# Patient Record
Sex: Female | Born: 1985 | Race: White | Hispanic: No | Marital: Married | Smoking: Former smoker
Health system: Southern US, Community
[De-identification: ages and names within clinical notes are randomized; demographics above are authoritative.]

## PROBLEM LIST (undated history)

## (undated) ENCOUNTER — Inpatient Hospital Stay: Payer: Self-pay

## (undated) DIAGNOSIS — F329 Major depressive disorder, single episode, unspecified: Secondary | ICD-10-CM

## (undated) DIAGNOSIS — R011 Cardiac murmur, unspecified: Secondary | ICD-10-CM

## (undated) DIAGNOSIS — F419 Anxiety disorder, unspecified: Secondary | ICD-10-CM

## (undated) DIAGNOSIS — R51 Headache: Secondary | ICD-10-CM

## (undated) DIAGNOSIS — R112 Nausea with vomiting, unspecified: Secondary | ICD-10-CM

## (undated) DIAGNOSIS — K219 Gastro-esophageal reflux disease without esophagitis: Secondary | ICD-10-CM

## (undated) DIAGNOSIS — Z9889 Other specified postprocedural states: Secondary | ICD-10-CM

## (undated) DIAGNOSIS — W44F3XA Food entering into or through a natural orifice, initial encounter: Secondary | ICD-10-CM

## (undated) DIAGNOSIS — F32A Depression, unspecified: Secondary | ICD-10-CM

## (undated) DIAGNOSIS — T4145XA Adverse effect of unspecified anesthetic, initial encounter: Secondary | ICD-10-CM

## (undated) DIAGNOSIS — T8859XA Other complications of anesthesia, initial encounter: Secondary | ICD-10-CM

## (undated) DIAGNOSIS — R519 Headache, unspecified: Secondary | ICD-10-CM

## (undated) DIAGNOSIS — Z8614 Personal history of Methicillin resistant Staphylococcus aureus infection: Secondary | ICD-10-CM

## (undated) HISTORY — PX: TONSILLECTOMY: SUR1361

## (undated) SURGERY — Surgical Case
Anesthesia: Spinal

---
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
## 2005-09-23 ENCOUNTER — Emergency Department: Payer: Self-pay | Admitting: Emergency Medicine

## 2005-11-09 ENCOUNTER — Ambulatory Visit (HOSPITAL_BASED_OUTPATIENT_CLINIC_OR_DEPARTMENT_OTHER): Admission: RE | Admit: 2005-11-09 | Discharge: 2005-11-09 | Payer: Self-pay | Admitting: Otolaryngology

## 2006-10-22 DIAGNOSIS — Z8614 Personal history of Methicillin resistant Staphylococcus aureus infection: Secondary | ICD-10-CM

## 2006-10-22 HISTORY — DX: Personal history of Methicillin resistant Staphylococcus aureus infection: Z86.14

## 2008-01-30 ENCOUNTER — Emergency Department: Payer: Self-pay | Admitting: Emergency Medicine

## 2008-08-26 ENCOUNTER — Ambulatory Visit: Payer: Self-pay | Admitting: Family Medicine

## 2008-09-20 ENCOUNTER — Emergency Department: Payer: Self-pay | Admitting: Emergency Medicine

## 2008-10-11 ENCOUNTER — Encounter: Payer: Self-pay | Admitting: Maternal & Fetal Medicine

## 2008-11-15 ENCOUNTER — Observation Stay: Payer: Self-pay | Admitting: Obstetrics & Gynecology

## 2008-12-20 ENCOUNTER — Encounter: Payer: Self-pay | Admitting: Obstetrics & Gynecology

## 2008-12-30 ENCOUNTER — Encounter: Payer: Self-pay | Admitting: Obstetrics and Gynecology

## 2009-01-06 ENCOUNTER — Encounter: Payer: Self-pay | Admitting: Maternal & Fetal Medicine

## 2009-01-31 ENCOUNTER — Encounter: Payer: Self-pay | Admitting: Obstetrics and Gynecology

## 2009-02-07 ENCOUNTER — Encounter: Payer: Self-pay | Admitting: Obstetrics and Gynecology

## 2009-02-14 ENCOUNTER — Encounter: Payer: Self-pay | Admitting: Maternal & Fetal Medicine

## 2009-02-21 ENCOUNTER — Encounter: Payer: Self-pay | Admitting: Obstetrics & Gynecology

## 2009-02-26 ENCOUNTER — Observation Stay: Payer: Self-pay | Admitting: Obstetrics and Gynecology

## 2009-02-28 ENCOUNTER — Inpatient Hospital Stay: Payer: Self-pay | Admitting: Obstetrics & Gynecology

## 2009-02-28 ENCOUNTER — Encounter: Payer: Self-pay | Admitting: Maternal & Fetal Medicine

## 2010-05-04 IMAGING — US ULTRAOUND OB LIMITED - NRPT [facility]
1 series · 14 of 14 positions shown · non-contrast
Comparison: none

[Series 1: ultraound ob limited - nrpt [facility] · 14 of 14 slices shown]
[im 1/14]
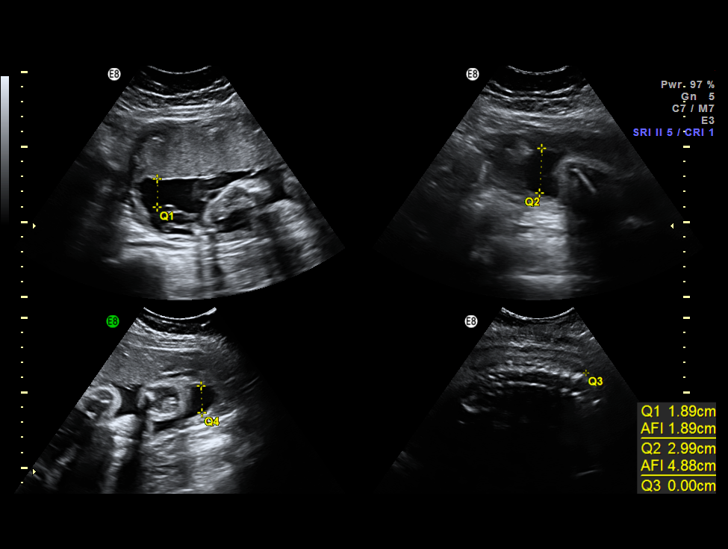
[im 2/14]
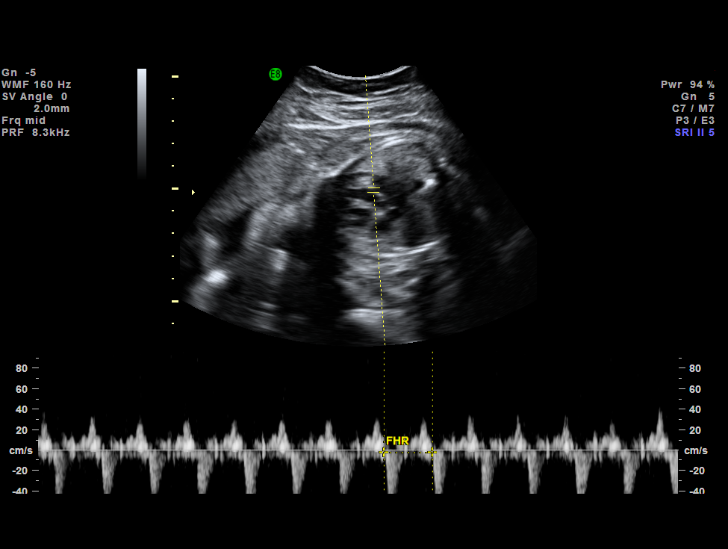
[im 3/14]
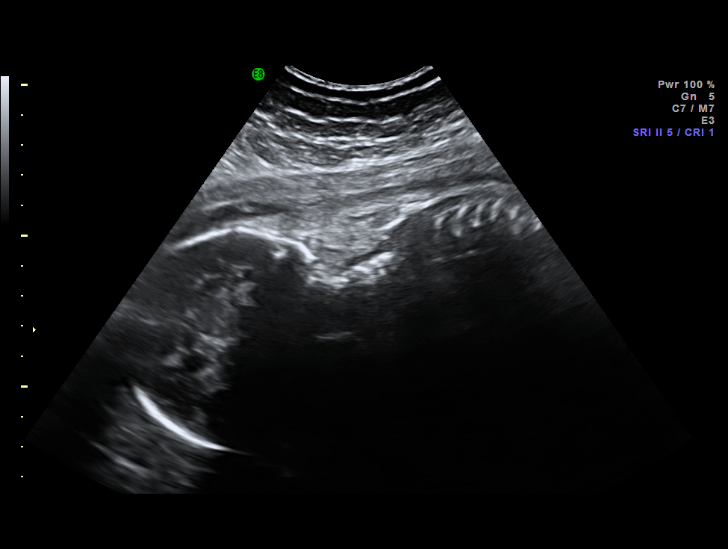
[im 4/14]
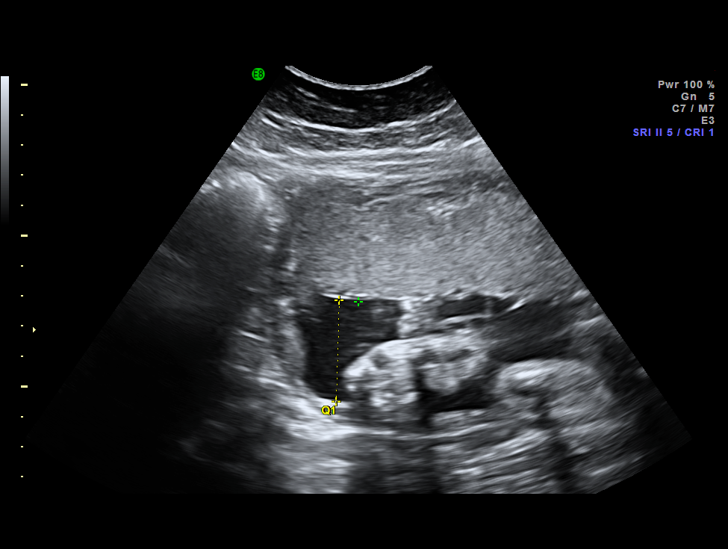
[im 5/14]
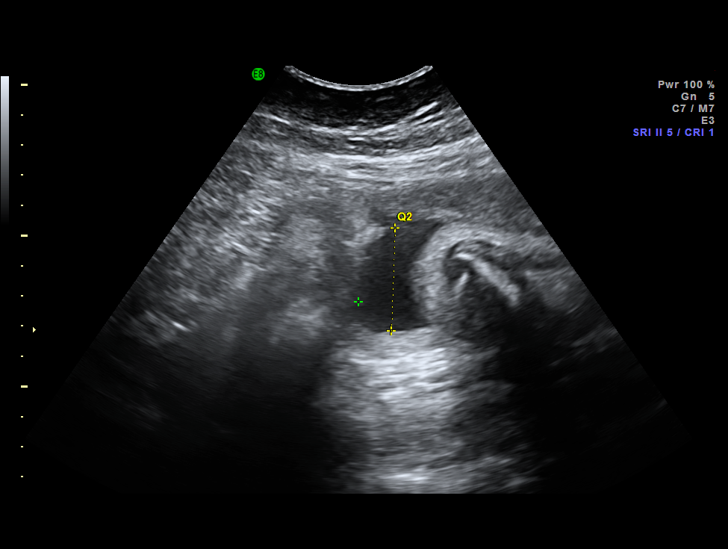
[im 6/14]
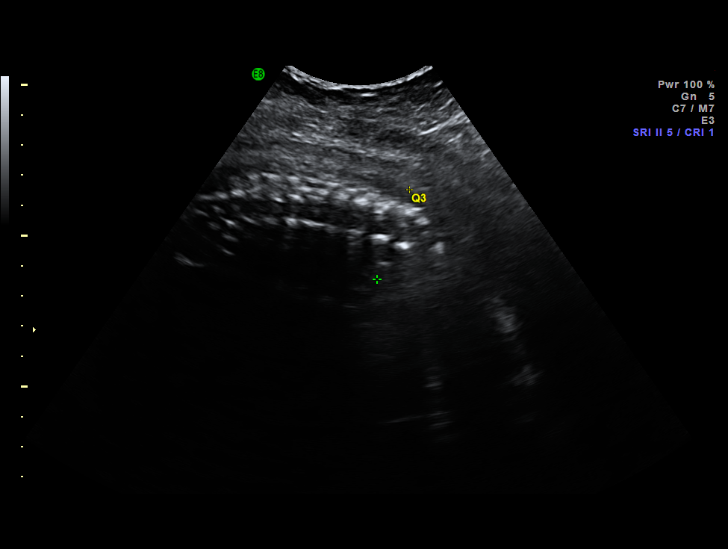
[im 7/14]
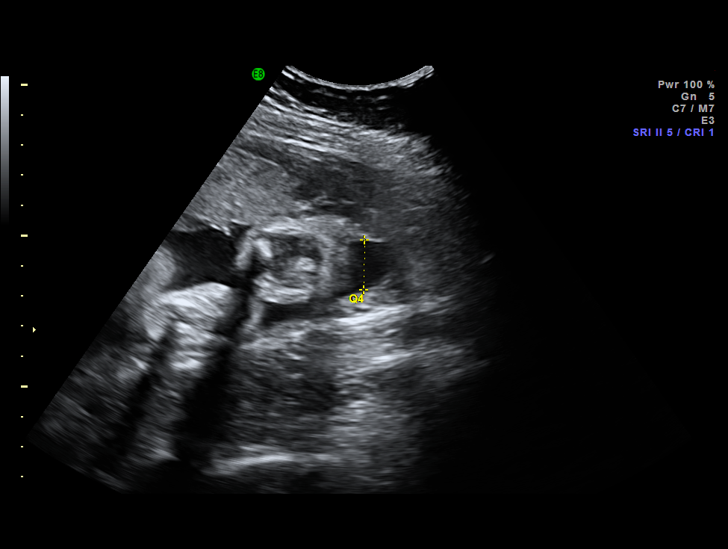
[im 8/14]
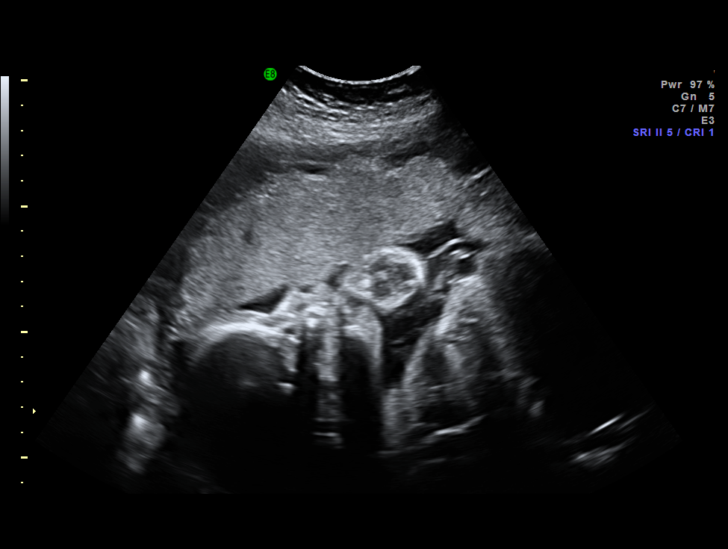
[im 9/14]
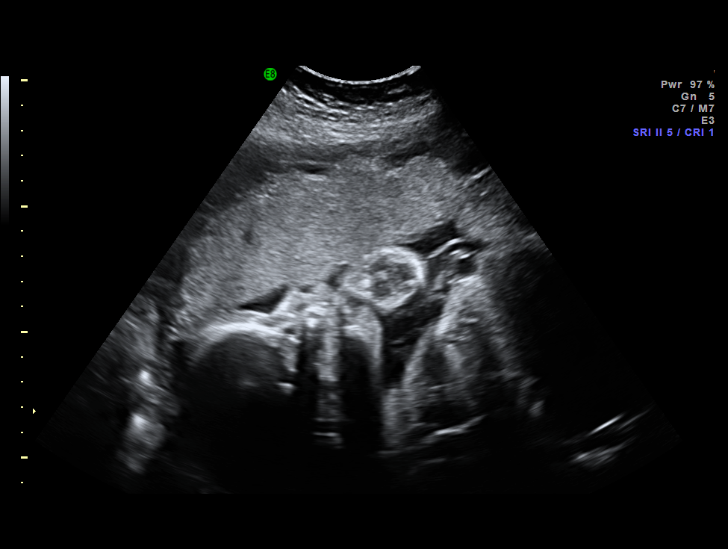
[im 10/14]
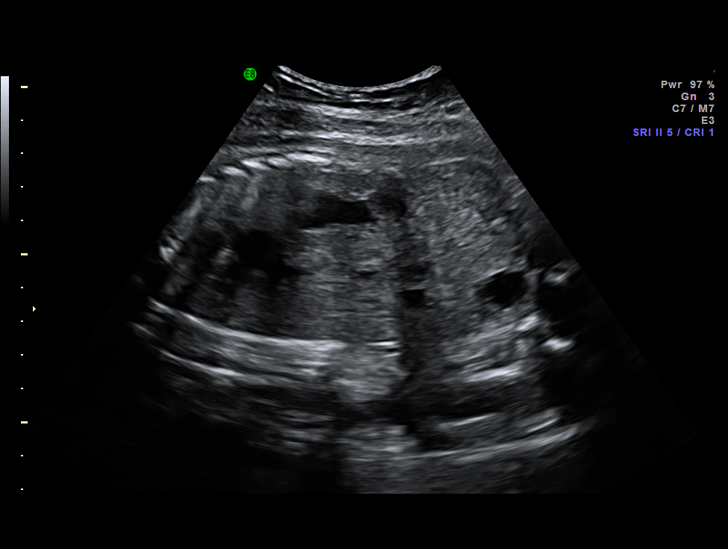
[im 11/14]
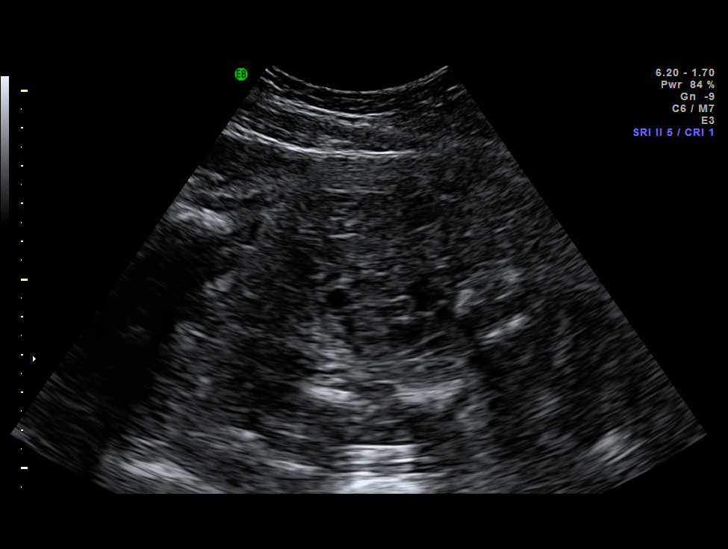
[im 12/14]
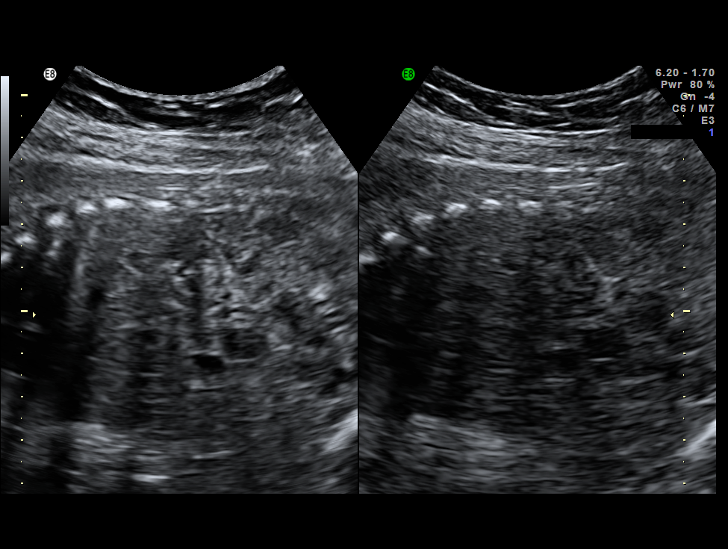
[im 13/14]
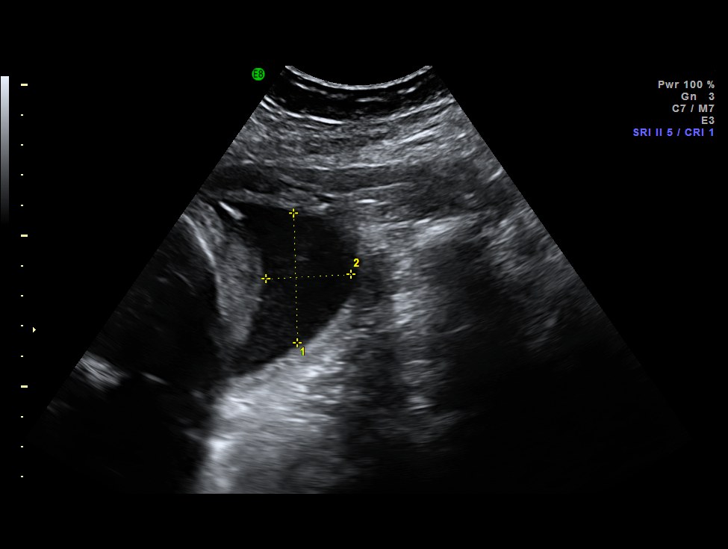
[im 14/14]
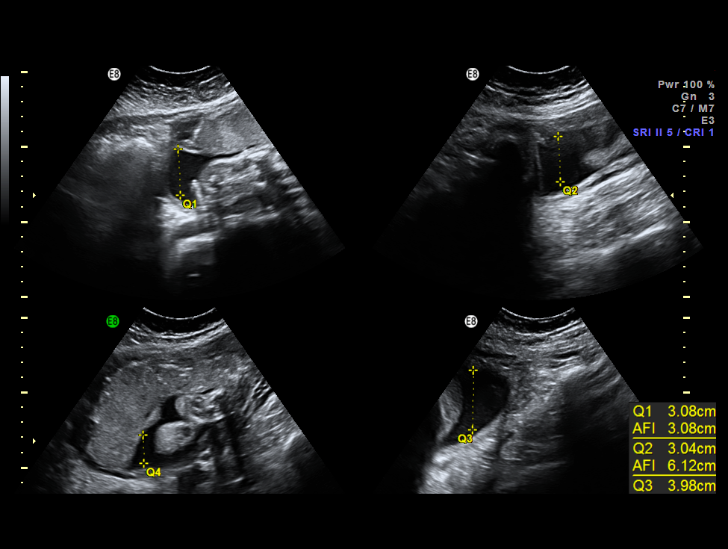

[14 of 14 positions shown; findings below may reference images not displayed]

IMAGES IMPORTED FROM THE [employer] WORKFLOW SYSTEM
NO DICTATION FOR STUDY

## 2010-06-26 IMAGING — US ULTRAOUND OB LIMITED - [facility]PT [facility]
1 series · 12 of 12 positions shown · non-contrast
Comparison: none

[Series 1: ultraound ob limited - nrpt [facility] · 0.35mm/px · 12 of 12 slices shown]
[im 1/12]
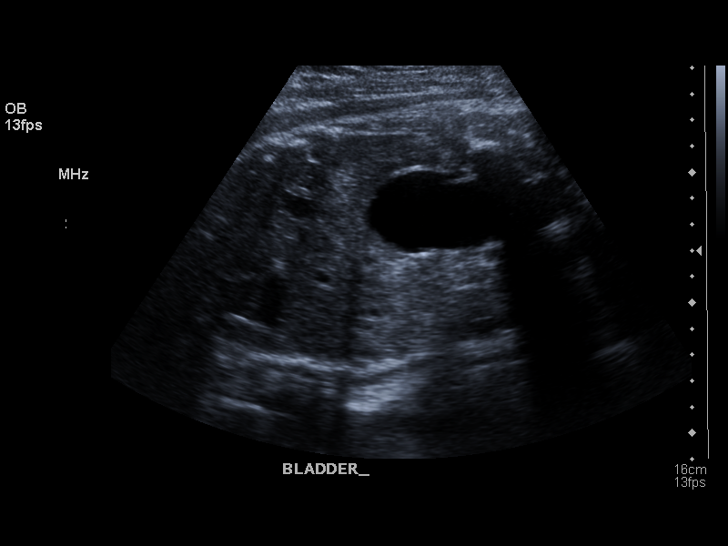
[im 2/12]
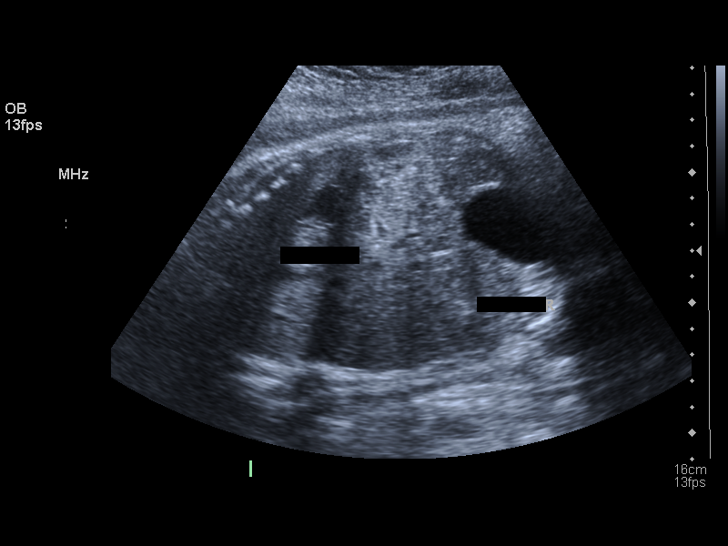
[im 3/12]
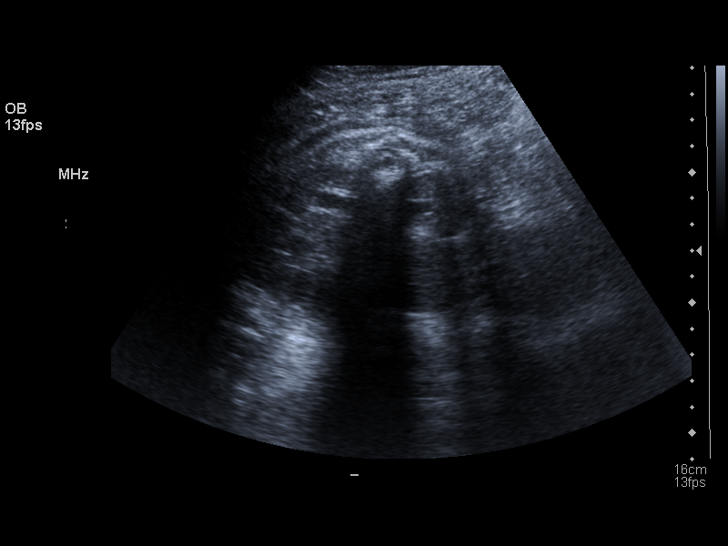
[im 4/12]
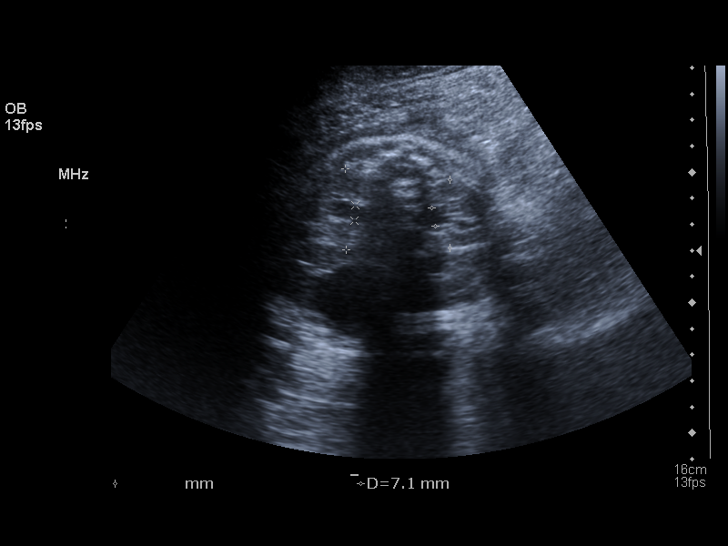
[im 5/12]
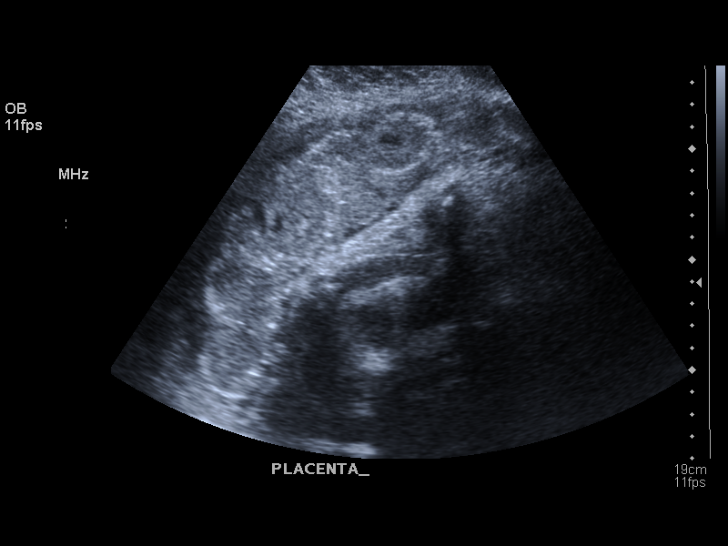
[im 6/12]
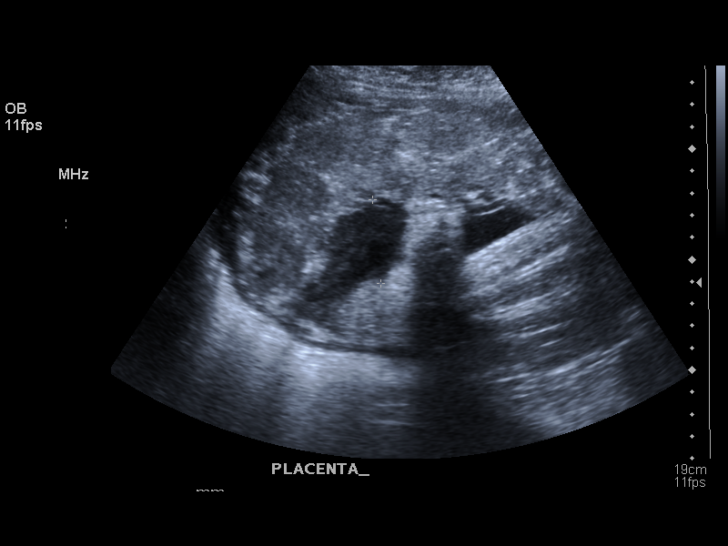
[im 7/12]
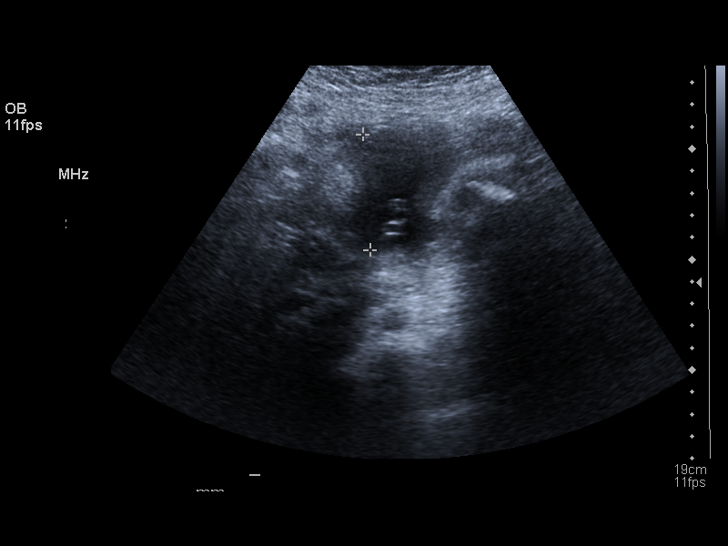
[im 8/12]
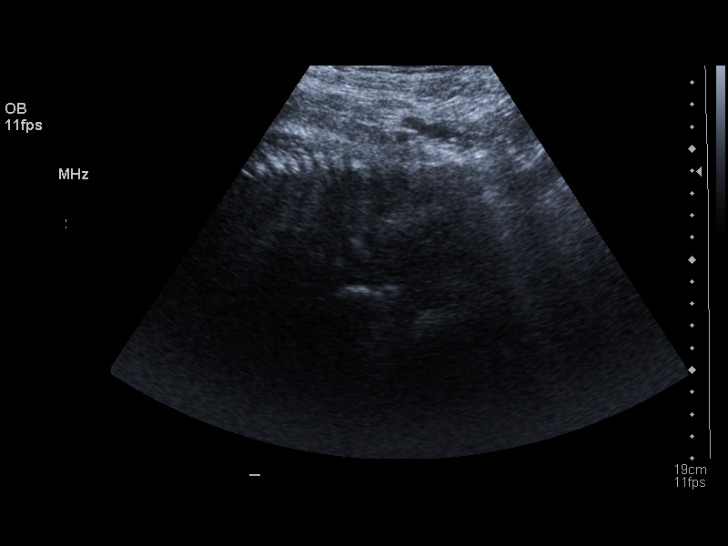
[im 9/12]
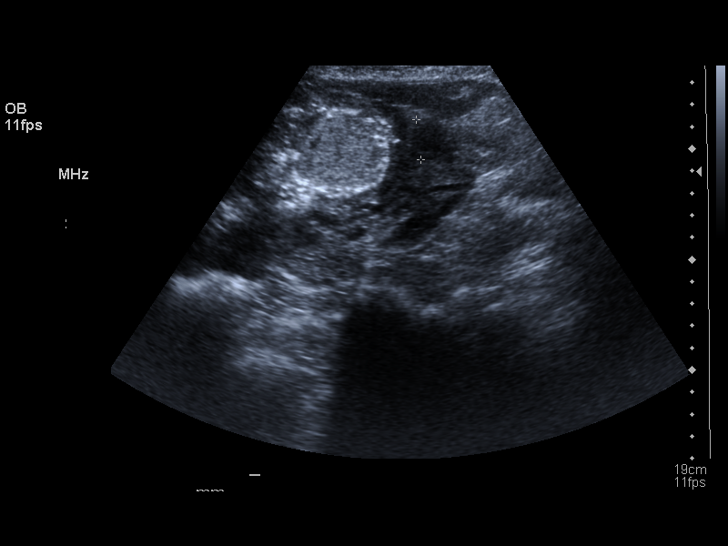
[im 10/12]
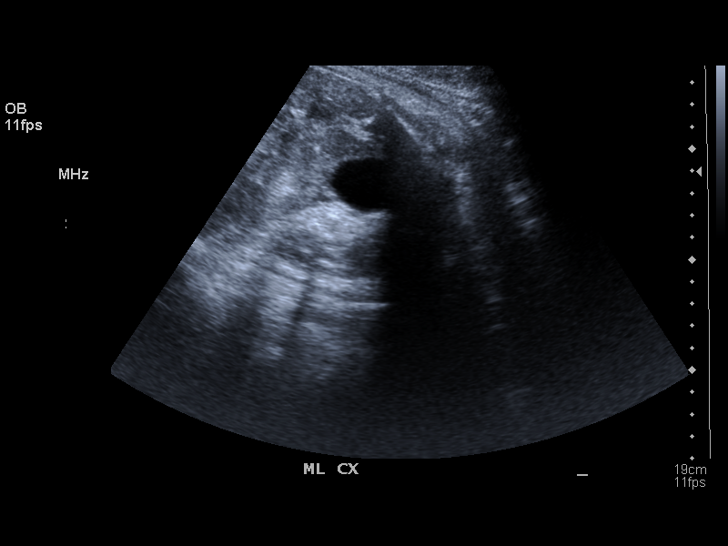
[im 11/12]
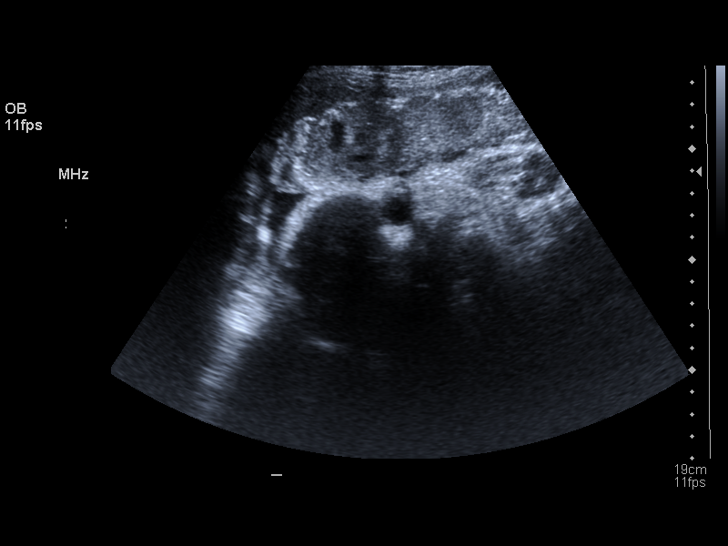
[im 12/12]
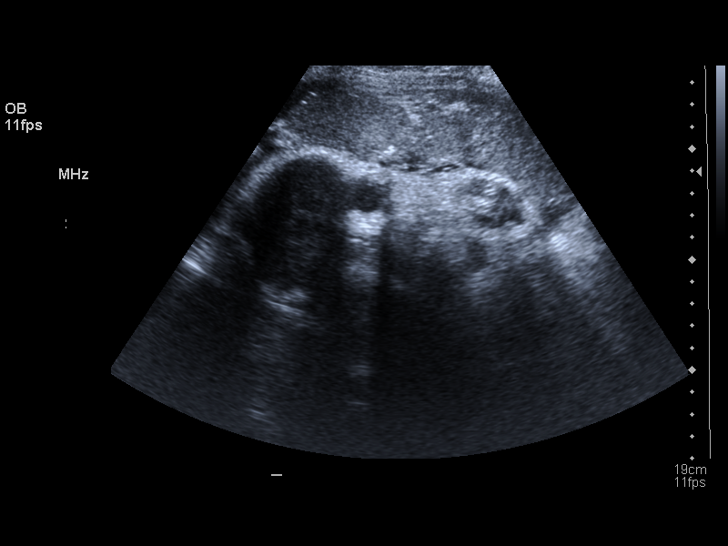

[12 of 12 positions shown; findings below may reference images not displayed]

IMAGES IMPORTED FROM THE [employer] WORKFLOW SYSTEM
NO DICTATION FOR STUDY

## 2015-10-26 ENCOUNTER — Encounter: Payer: Self-pay | Admitting: Obstetrics and Gynecology

## 2016-04-16 ENCOUNTER — Observation Stay
Admission: EM | Admit: 2016-04-16 | Discharge: 2016-04-16 | Disposition: A | Discharge status: Home / Self Care | Payer: BLUE CROSS/BLUE SHIELD | Attending: Obstetrics and Gynecology | Admitting: Obstetrics and Gynecology

## 2016-04-16 ENCOUNTER — Encounter: Payer: Self-pay | Admitting: *Deleted

## 2016-04-16 DIAGNOSIS — Z3A31 31 weeks gestation of pregnancy: Secondary | ICD-10-CM | POA: Insufficient documentation

## 2016-04-16 DIAGNOSIS — M546 Pain in thoracic spine: Secondary | ICD-10-CM | POA: Insufficient documentation

## 2016-04-16 DIAGNOSIS — O9989 Other specified diseases and conditions complicating pregnancy, childbirth and the puerperium: Secondary | ICD-10-CM | POA: Diagnosis not present

## 2016-04-16 DIAGNOSIS — O99891 Other specified diseases and conditions complicating pregnancy: Secondary | ICD-10-CM | POA: Diagnosis present

## 2016-04-16 DIAGNOSIS — M549 Dorsalgia, unspecified: Secondary | ICD-10-CM | POA: Diagnosis present

## 2016-04-16 HISTORY — DX: Anxiety disorder, unspecified: F41.9

## 2016-04-16 HISTORY — DX: Depression, unspecified: F32.A

## 2016-04-16 HISTORY — DX: Major depressive disorder, single episode, unspecified: F32.9

## 2016-04-16 LAB — URINALYSIS COMPLETE WITH MICROSCOPIC (ARMC ONLY)
BILIRUBIN URINE: NEGATIVE
Bacteria, UA: NONE SEEN
GLUCOSE, UA: NEGATIVE mg/dL
HGB URINE DIPSTICK: NEGATIVE
Ketones, ur: NEGATIVE mg/dL
Leukocytes, UA: NEGATIVE
NITRITE: NEGATIVE
Protein, ur: NEGATIVE mg/dL
RBC / HPF: NONE SEEN RBC/hpf (ref 0–5)
SPECIFIC GRAVITY, URINE: 1.013 (ref 1.005–1.030)
Squamous Epithelial / LPF: NONE SEEN
WBC, UA: NONE SEEN WBC/hpf (ref 0–5)
pH: 6 (ref 5.0–8.0)

## 2016-04-16 MED ORDER — ACETAMINOPHEN 325 MG PO TABS: 650.0000 mg | ORAL_TABLET | ORAL | Status: DC | PRN

## 2016-04-16 NOTE — OB Triage Note (Signed)
Pt recvd from ED. C/o back pain and stomach pain. No vaginal bleeding, no intercourse in past 24 hours, feeling baby move ok, staying well hydrated.

## 2016-04-16 NOTE — Final Progress Note (Signed)
Physician Final Progress Note  Patient ID: Brittany [name] MRN: [id number] DOB/AGE: 31/12/1985 30 y.o.  Admit date: 04/16/2016 Admitting provider: Vena AustriaAndreas Neomia Herbel, MD Discharge date: 04/16/2016   Admission Diagnoses: Back pain  Discharge Diagnoses:  Active Problems:   Back pain affecting pregnancy, antepartum  No contractions on tocometer, pain had resolved by the time of admission.  Mid thoracic back pain.  Also anterior epigastric pain.  If persists told to represent, consideration for physical therapy if continued pain.   Consults: None  Significant Findings/ Diagnostic Studies:  Results for orders placed or performed during the hospital encounter of 04/16/16 (from the past 24 hour(s))  Urinalysis complete, with microscopic ([facility] only)     Status: Abnormal   Collection Time: 04/16/16  4:54 AM  Result Value Ref Range   Color, Urine YELLOW (A) YELLOW   APPearance CLEAR (A) CLEAR   Glucose, UA NEGATIVE NEGATIVE mg/dL   Bilirubin Urine NEGATIVE NEGATIVE   Ketones, ur NEGATIVE NEGATIVE mg/dL   Specific Gravity, Urine 1.013 1.005 - 1.030   Hgb urine dipstick NEGATIVE NEGATIVE   pH 6.0 5.0 - 8.0   Protein, ur NEGATIVE NEGATIVE mg/dL   Nitrite NEGATIVE NEGATIVE   Leukocytes, UA NEGATIVE NEGATIVE   RBC / HPF NONE SEEN 0 - 5 RBC/hpf   WBC, UA NONE SEEN 0 - 5 WBC/hpf   Bacteria, UA NONE SEEN NONE SEEN   Squamous Epithelial / LPF NONE SEEN NONE SEEN     Procedures: NST 140, moderate variability, +accels, no decels  Discharge Condition: good  Disposition: home  Diet: Regular diet  Discharge Activity: Activity as tolerated  Discharge Instructions    Discharge activity:  No Restrictions    Complete by:  As directed      No sexual activity restrictions    Complete by:  As directed      Notify physician for a general feeling that "something is not right"    Complete by:  As directed      Notify physician for increase or change in vaginal discharge    Complete by:  As  directed      Notify physician for intestinal cramps, with or without diarrhea, sometimes described as "gas pain"    Complete by:  As directed      Notify physician for leaking of fluid    Complete by:  As directed      Notify physician for low, dull backache, unrelieved by heat or Tylenol    Complete by:  As directed      Notify physician for menstrual like cramps    Complete by:  As directed      Notify physician for pelvic pressure    Complete by:  As directed      Notify physician for uterine contractions.  These may be painless and feel like the uterus is tightening or the baby is  "balling up"    Complete by:  As directed      Notify physician for vaginal bleeding    Complete by:  As directed      PRETERM LABOR:  Includes any of the follwing symptoms that occur between 20 - [redacted] weeks gestation.  If these symptoms are not stopped, preterm labor can result in preterm delivery, placing your baby at risk    Complete by:  As directed             Medication List    TAKE these medications        acetaminophen  500 MG tablet  Commonly known as:  TYLENOL  Take 1,000 mg by mouth every 6 (six) hours as needed.     escitalopram 10 MG tablet  Commonly known as:  LEXAPRO  Take 10 mg by mouth daily.     ranitidine 150 MG tablet  Commonly known as:  ZANTAC  Take 150 mg by mouth 2 (two) times daily.         Total time spent taking care of this patient: 30 minutes  Signed: Lorrene ReidSTAEBLER, Sameeha Rockefeller M 04/16/2016, 5:48 AM

## 2016-06-11 ENCOUNTER — Inpatient Hospital Stay
Admission: EM | Admit: 2016-06-11 | Discharge: 2016-06-14 | DRG: 765 | Disposition: A | Discharge status: Home / Self Care | Payer: BLUE CROSS/BLUE SHIELD | Attending: Obstetrics and Gynecology | Admitting: Obstetrics and Gynecology

## 2016-06-11 ENCOUNTER — Inpatient Hospital Stay: Payer: BLUE CROSS/BLUE SHIELD | Admitting: Anesthesiology

## 2016-06-11 ENCOUNTER — Inpatient Hospital Stay: Admit: 2016-06-11 | Payer: Self-pay | Admitting: Obstetrics and Gynecology

## 2016-06-11 ENCOUNTER — Encounter
Admission: EM | Disposition: A | Discharge status: Home / Self Care | Payer: Self-pay | Source: Home / Self Care | Attending: Obstetrics and Gynecology

## 2016-06-11 DIAGNOSIS — Z3A36 36 weeks gestation of pregnancy: Secondary | ICD-10-CM

## 2016-06-11 DIAGNOSIS — O34211 Maternal care for low transverse scar from previous cesarean delivery: Secondary | ICD-10-CM | POA: Diagnosis present

## 2016-06-11 DIAGNOSIS — O9902 Anemia complicating childbirth: Secondary | ICD-10-CM | POA: Diagnosis present

## 2016-06-11 DIAGNOSIS — K660 Peritoneal adhesions (postprocedural) (postinfection): Secondary | ICD-10-CM | POA: Diagnosis present

## 2016-06-11 DIAGNOSIS — D62 Acute posthemorrhagic anemia: Secondary | ICD-10-CM | POA: Diagnosis present

## 2016-06-11 DIAGNOSIS — O4290 Premature rupture of membranes, unspecified as to length of time between rupture and onset of labor, unspecified weeks of gestation: Secondary | ICD-10-CM | POA: Diagnosis present

## 2016-06-11 LAB — CBC
HEMATOCRIT: 36.8 % (ref 35.0–47.0)
HEMOGLOBIN: 12.6 g/dL (ref 12.0–16.0)
MCH: 30 pg (ref 26.0–34.0)
MCHC: 34.4 g/dL (ref 32.0–36.0)
MCV: 87.3 fL (ref 80.0–100.0)
Platelets: 218 10*3/uL (ref 150–440)
RBC: 4.21 MIL/uL (ref 3.80–5.20)
RDW: 14.2 % (ref 11.5–14.5)
WBC: 11.1 10*3/uL — ABNORMAL HIGH (ref 3.6–11.0)

## 2016-06-11 LAB — TYPE AND SCREEN
ABO/RH(D): B POS
Antibody Screen: NEGATIVE

## 2016-06-11 SURGERY — Surgical Case
Anesthesia: Spinal | Site: Abdomen | Wound class: Clean

## 2016-06-11 MED ORDER — IBUPROFEN 600 MG PO TABS
600.0000 mg | ORAL_TABLET | Freq: Four times a day (QID) | ORAL | Status: DC
  Administered 2016-06-12 – 2016-06-14 (×8): 600 mg via ORAL
  Filled 2016-06-11 (×8): qty 1

## 2016-06-11 MED ORDER — DIBUCAINE 1 % RE OINT: 1.0000 "application " | TOPICAL_OINTMENT | RECTAL | Status: DC | PRN

## 2016-06-11 MED ORDER — BUPIVACAINE HCL 0.5 % IJ SOLN
INTRAMUSCULAR | Status: DC | PRN
  Administered 2016-06-11: 10 mL

## 2016-06-11 MED ORDER — MENTHOL 3 MG MT LOZG
1.0000 | LOZENGE | OROMUCOSAL | Status: DC | PRN
  Filled 2016-06-11: qty 9

## 2016-06-11 MED ORDER — OXYTOCIN 40 UNITS IN LACTATED RINGERS INFUSION - SIMPLE MED
2.5000 [IU]/h | INTRAVENOUS | Status: AC
  Administered 2016-06-11: 2.5 [IU]/h via INTRAVENOUS

## 2016-06-11 MED ORDER — FENTANYL CITRATE (PF) 100 MCG/2ML IJ SOLN
INTRAMUSCULAR | Status: DC | PRN
  Administered 2016-06-11: 15 ug via INTRATHECAL

## 2016-06-11 MED ORDER — SIMETHICONE 80 MG PO CHEW
80.0000 mg | CHEWABLE_TABLET | ORAL | Status: DC
  Administered 2016-06-11 – 2016-06-13 (×3): 80 mg via ORAL
  Filled 2016-06-11 (×3): qty 1

## 2016-06-11 MED ORDER — FENTANYL CITRATE (PF) 100 MCG/2ML IJ SOLN: 25.0000 ug | INTRAMUSCULAR | Status: DC | PRN

## 2016-06-11 MED ORDER — BUPIVACAINE 0.25 % ON-Q PUMP DUAL CATH 400 ML
400.0000 mL | INJECTION | Status: DC
  Filled 2016-06-11: qty 400

## 2016-06-11 MED ORDER — OXYCODONE-ACETAMINOPHEN 5-325 MG PO TABS: 2.0000 | ORAL_TABLET | ORAL | Status: DC | PRN

## 2016-06-11 MED ORDER — SIMETHICONE 80 MG PO CHEW
80.0000 mg | CHEWABLE_TABLET | Freq: Three times a day (TID) | ORAL | Status: DC
  Administered 2016-06-11 – 2016-06-14 (×6): 80 mg via ORAL
  Filled 2016-06-11 (×8): qty 1

## 2016-06-11 MED ORDER — SIMETHICONE 80 MG PO CHEW: 80.0000 mg | CHEWABLE_TABLET | ORAL | Status: DC | PRN

## 2016-06-11 MED ORDER — OXYTOCIN 40 UNITS IN LACTATED RINGERS INFUSION - SIMPLE MED
INTRAVENOUS | Status: DC | PRN
  Administered 2016-06-11 (×2): 40 mL via INTRAVENOUS

## 2016-06-11 MED ORDER — ONDANSETRON HCL 4 MG/2ML IJ SOLN
4.0000 mg | Freq: Once | INTRAMUSCULAR | Status: AC | PRN
  Administered 2016-06-11: 4 mg via INTRAVENOUS

## 2016-06-11 MED ORDER — FAMOTIDINE 20 MG PO TABS
ORAL_TABLET | ORAL | Status: AC
  Administered 2016-06-11: 20 mg via ORAL
  Filled 2016-06-11: qty 1

## 2016-06-11 MED ORDER — WITCH HAZEL-GLYCERIN EX PADS: 1.0000 "application " | MEDICATED_PAD | CUTANEOUS | Status: DC | PRN

## 2016-06-11 MED ORDER — BUPIVACAINE HCL 0.5 % IJ SOLN
15.0000 mL | Freq: Once | INTRAMUSCULAR | Status: DC
  Filled 2016-06-11 (×3): qty 15

## 2016-06-11 MED ORDER — COCONUT OIL OIL
1.0000 | TOPICAL_OIL | Status: DC | PRN
Start: 2016-06-11 — End: 2016-06-14
  Administered 2016-06-11: 1 via TOPICAL
  Filled 2016-06-11: qty 120

## 2016-06-11 MED ORDER — BUPIVACAINE IN DEXTROSE 0.75-8.25 % IT SOLN
INTRATHECAL | Status: DC | PRN
  Administered 2016-06-11: 1.5 mL via INTRATHECAL

## 2016-06-11 MED ORDER — SENNOSIDES-DOCUSATE SODIUM 8.6-50 MG PO TABS
2.0000 | ORAL_TABLET | ORAL | Status: DC
  Administered 2016-06-11 – 2016-06-13 (×3): 2 via ORAL
  Filled 2016-06-11 (×3): qty 2

## 2016-06-11 MED ORDER — LACTATED RINGERS IV SOLN
INTRAVENOUS | Status: DC
  Administered 2016-06-11 – 2016-06-12 (×2): via INTRAVENOUS

## 2016-06-11 MED ORDER — LACTATED RINGERS IV SOLN
Freq: Once | INTRAVENOUS | Status: AC
  Administered 2016-06-11: 500 mL via INTRAVENOUS

## 2016-06-11 MED ORDER — OXYTOCIN 40 UNITS IN LACTATED RINGERS INFUSION - SIMPLE MED
INTRAVENOUS | Status: AC
  Filled 2016-06-11: qty 1000

## 2016-06-11 MED ORDER — PHENYLEPHRINE 40 MCG/ML (10ML) SYRINGE FOR IV PUSH (FOR BLOOD PRESSURE SUPPORT)
PREFILLED_SYRINGE | INTRAVENOUS | Status: DC | PRN
  Administered 2016-06-11: 100 ug via INTRAVENOUS
  Administered 2016-06-11: 120 ug via INTRAVENOUS
  Administered 2016-06-11: 100 ug via INTRAVENOUS
  Administered 2016-06-11: 200 ug via INTRAVENOUS
  Administered 2016-06-11: 100 ug via INTRAVENOUS
  Administered 2016-06-11: 200 ug via INTRAVENOUS
  Administered 2016-06-11: 100 ug via INTRAVENOUS
  Administered 2016-06-11: 200 ug via INTRAVENOUS

## 2016-06-11 MED ORDER — SOD CITRATE-CITRIC ACID 500-334 MG/5ML PO SOLN
ORAL | Status: AC
  Administered 2016-06-11: 30 mL
  Filled 2016-06-11: qty 15

## 2016-06-11 MED ORDER — OXYCODONE-ACETAMINOPHEN 5-325 MG PO TABS: 1.0000 | ORAL_TABLET | ORAL | Status: DC | PRN

## 2016-06-11 MED ORDER — LACTATED RINGERS IV SOLN
Freq: Once | INTRAVENOUS | Status: AC
  Administered 2016-06-11: 125 mL/h via INTRAVENOUS

## 2016-06-11 MED ORDER — CEFAZOLIN SODIUM-DEXTROSE 2-4 GM/100ML-% IV SOLN
2.0000 g | INTRAVENOUS | Status: AC
  Administered 2016-06-11 (×2): 2 g via INTRAVENOUS
  Filled 2016-06-11: qty 100

## 2016-06-11 MED ORDER — MORPHINE SULFATE (PF) 0.5 MG/ML IJ SOLN
INTRAMUSCULAR | Status: DC | PRN
  Administered 2016-06-11: .2 mg via INTRATHECAL

## 2016-06-11 MED ORDER — BUPIVACAINE HCL (PF) 0.5 % IJ SOLN
INTRAMUSCULAR | Status: AC
  Filled 2016-06-11: qty 30

## 2016-06-11 MED ORDER — BUPIVACAINE 0.25 % ON-Q PUMP DUAL CATH 300 ML: INJECTION | Status: DC | PRN

## 2016-06-11 MED ORDER — FAMOTIDINE 20 MG PO TABS
20.0000 mg | ORAL_TABLET | Freq: Two times a day (BID) | ORAL | Status: DC
  Administered 2016-06-11 – 2016-06-14 (×7): 20 mg via ORAL
  Filled 2016-06-11 (×6): qty 1

## 2016-06-11 MED ORDER — OXYTOCIN 40 UNITS IN LACTATED RINGERS INFUSION - SIMPLE MED
INTRAVENOUS | Status: AC
  Administered 2016-06-11: 2.5 [IU]/h via INTRAVENOUS
  Filled 2016-06-11: qty 1000

## 2016-06-11 MED ORDER — ACETAMINOPHEN 325 MG PO TABS
650.0000 mg | ORAL_TABLET | ORAL | Status: DC | PRN
  Administered 2016-06-12 – 2016-06-14 (×8): 650 mg via ORAL
  Filled 2016-06-11 (×8): qty 2

## 2016-06-11 MED ORDER — LACTATED RINGERS IV SOLN
INTRAVENOUS | Status: DC | PRN
  Administered 2016-06-11: 07:00:00 via INTRAVENOUS

## 2016-06-11 MED ORDER — PRENATAL MULTIVITAMIN CH
1.0000 | ORAL_TABLET | Freq: Every day | ORAL | Status: DC
  Administered 2016-06-12 – 2016-06-14 (×3): 1 via ORAL
  Filled 2016-06-11 (×4): qty 1

## 2016-06-11 MED ORDER — DIPHENHYDRAMINE HCL 25 MG PO CAPS: 25.0000 mg | ORAL_CAPSULE | Freq: Four times a day (QID) | ORAL | Status: DC | PRN

## 2016-06-11 SURGICAL SUPPLY — 31 items
CANISTER SUCT 3000ML (MISCELLANEOUS) ×3 IMPLANT
CATH KIT ON-Q SILVERSOAK 5 (CATHETERS) ×2 IMPLANT
CATH KIT ON-Q SILVERSOAK 5IN (CATHETERS) ×6 IMPLANT
CLOSURE WOUND 1/2 X4 (GAUZE/BANDAGES/DRESSINGS) ×1
DRSG OPSITE POSTOP 4X10 (GAUZE/BANDAGES/DRESSINGS) ×3 IMPLANT
DRSG TELFA 3X8 NADH (GAUZE/BANDAGES/DRESSINGS) ×3 IMPLANT
ELECT CAUTERY BLADE 6.4 (BLADE) ×3 IMPLANT
ELECT REM PT RETURN 9FT ADLT (ELECTROSURGICAL) ×3
ELECTRODE REM PT RTRN 9FT ADLT (ELECTROSURGICAL) ×1 IMPLANT
GAUZE SPONGE 4X4 12PLY STRL (GAUZE/BANDAGES/DRESSINGS) ×3 IMPLANT
GLOVE BIO SURGEON STRL SZ7 (GLOVE) ×3 IMPLANT
GLOVE INDICATOR 7.5 STRL GRN (GLOVE) ×3 IMPLANT
GOWN STRL REUS W/ TWL LRG LVL3 (GOWN DISPOSABLE) ×3 IMPLANT
GOWN STRL REUS W/TWL LRG LVL3 (GOWN DISPOSABLE) ×9
LIQUID BAND (GAUZE/BANDAGES/DRESSINGS) ×3 IMPLANT
NS IRRIG 1000ML POUR BTL (IV SOLUTION) ×3 IMPLANT
PACK C SECTION AR (MISCELLANEOUS) ×3 IMPLANT
PAD DRESSING TELFA 3X8 NADH (GAUZE/BANDAGES/DRESSINGS) ×1 IMPLANT
PAD OB MATERNITY 4.3X12.25 (PERSONAL CARE ITEMS) ×6 IMPLANT
PAD PREP 24X41 OB/GYN DISP (PERSONAL CARE ITEMS) ×3 IMPLANT
SPONGE LAP 18X18 5 PK (GAUZE/BANDAGES/DRESSINGS) ×6 IMPLANT
STRIP CLOSURE SKIN 1/2X4 (GAUZE/BANDAGES/DRESSINGS) ×2 IMPLANT
SUT CHROMIC GUT BROWN 0 54 (SUTURE) ×1 IMPLANT
SUT CHROMIC GUT BROWN 0 54IN (SUTURE) ×3
SUT MNCRL 4-0 (SUTURE) ×3
SUT MNCRL 4-0 27XMFL (SUTURE) ×1
SUT PDS AB 1 TP1 96 (SUTURE) ×3 IMPLANT
SUT PLAIN 2 0 XLH (SUTURE) ×3 IMPLANT
SUT VIC AB 0 CT1 36 (SUTURE) ×12 IMPLANT
SUTURE MNCRL 4-0 27XMF (SUTURE) ×1 IMPLANT
SWABSTK COMLB BENZOIN TINCTURE (MISCELLANEOUS) ×3 IMPLANT

## 2016-06-11 NOTE — Anesthesia Preprocedure Evaluation (Signed)
Anesthesia Evaluation  Patient identified by MRN, date of birth, ID band Patient awake    Reviewed: Allergy & Precautions, NPO status , Patient's Chart, lab work & pertinent test results, reviewed documented beta blocker date and time   Airway Mallampati: II  TM Distance: >3 FB     Dental  (+) Chipped   Pulmonary           Cardiovascular      Neuro/Psych PSYCHIATRIC DISORDERS Anxiety Depression    GI/Hepatic   Endo/Other    Renal/GU      Musculoskeletal   Abdominal   Peds  Hematology   Anesthesia Other Findings   Reproductive/Obstetrics                             Anesthesia Physical Anesthesia Plan  ASA: II  Anesthesia Plan: Spinal   Post-op Pain Management:    Induction:   Airway Management Planned:   Additional Equipment:   Intra-op Plan:   Post-operative Plan:   Informed Consent: I have reviewed the patients History and Physical, chart, labs and discussed the procedure including the risks, benefits and alternatives for the proposed anesthesia with the patient or authorized representative who has indicated his/her understanding and acceptance.     Plan Discussed with: CRNA  Anesthesia Plan Comments:         Anesthesia Quick Evaluation

## 2016-06-11 NOTE — Anesthesia Procedure Notes (Signed)
Spinal  Patient location during procedure: OB Staffing Anesthesiologist: Molli Barrows Performed: anesthesiologist  Preanesthetic Checklist Completed: patient identified, site marked, surgical consent, pre-op evaluation, timeout performed, IV checked, risks and benefits discussed and monitors and equipment checked Spinal Block Patient position: sitting Prep: ChloraPrep Patient monitoring: heart rate, continuous pulse ox, blood pressure and cardiac monitor Approach: midline Location: L4-5 Injection technique: single-shot Needle Needle type: Whitacre and Introducer  Needle gauge: 25 G Needle length: 9 cm Assessment Sensory level: T4 Additional Notes Negative paresthesia. Negative blood return. Positive free-flowing CSF. Expiration date of kit checked and confirmed. Patient tolerated procedure well, without complications. JA

## 2016-06-11 NOTE — OB Triage Note (Signed)
Patient came in for observation for labor evaluation. Patient is a previous cesarean section. Patient reports large gush of clear fluid at home around 0400. Patient denies vaginal bleeding and spotting. Vital signs stable and patient afebrile. Husband at bedside. Husband at bedside. Will continue to monitor.

## 2016-06-11 NOTE — Transfer of Care (Signed)
Immediate Anesthesia Transfer of Care Note  Patient: Brittany [name]  Procedure(s) Performed: Procedure(s) with comments: REPEAT CESAREAN SECTION (N/A) - Female born @ 130755 Wt: 7lb 7oz Apgars: 8/9  Patient Location: PACU  Anesthesia Type:Spinal  Level of Consciousness: awake  Airway & Oxygen Therapy: Patient Spontanous Breathing and Patient connected to nasal cannula oxygen  Post-op Assessment: Report given to RN and Post -op Vital signs reviewed and stable  Post vital signs: Reviewed and stable  Last Vitals:  Vitals:   06/11/16 0705 06/11/16 0843  BP:  (!) (P) 101/45  Pulse:  (P) 67  Resp: 17 (P) 18  Temp: 36.8 C (P) 36.4 C    Last Pain:  Vitals:   06/11/16 0843  TempSrc: (P) Oral  PainSc:          Complications: No apparent anesthesia complications

## 2016-06-11 NOTE — Progress Notes (Signed)
RN to the bedside to assist with pericare, Foley emptied with 150 ml of clear yellow urine.  Pt. Tolerated ice chips without emesis.  LR with 40U pitocin infusing at 62.5 ml/hr; also LR infusing at 125 ml/hr.  Pt. Continues to have low blood pressure. Pt. Stable with no current complaints.  [facility]  Infant at the bedside, infant also transferred.

## 2016-06-11 NOTE — Op Note (Addendum)
Preoperative Diagnosis: 1) 30 y.o. [id number] at 154w5d 2) History of prior C-section 3) Spontaneous rupture of membranes  Postoperative Diagnosis: 1) 30 y.o. [id number] at 9354w5d 2) History of prior C-section 3) Spontaneous rupture of membranes  Operation Performed: Repeat low transverse C-section via pfannenstiel skin incision  Indiciation: rupture of membranes  Anesthesia: .Spinal  Primary Surgeon: Vena AustriaAndreas Taelar Gronewold, MD   Preoperative Antibiotics: 2g ancef  Estimated Blood Loss:  500mL  IV Fluids: 1L  Urine Output:: pending  Drains or Tubes: Foley to gravity drainage, ON-Q catheter system  Implants: none  Specimens Removed: none  Complications: none  Intraoperative Findings:  Normal tubes ovaries and uterus.  Thin lower uterine segment, amenable to single layer closure only.  Significant adhesions of omentum to bladder reflection.  Delivery at 0755 resulted in the birth of a liveborn female, APGAR (1 MIN): 8 , APGAR (5 MINS): 9, weight 7lbs 7oz  Patient Condition:stable  Procedure in Detail:  Patient was taken to the operating room were she was administered regional anesthesia.  She was positioned in the supine position, prepped and draped in the  Usual sterile fashion.  Prior to proceeding with the case a time out was performed and the level of anesthetic was checked and noted to be adequate.  Utilizing the scalpel a pfannenstiel skin incision was made 2cm above the pubic symphysis utilizing the patient's pre-existing scar and carried down sharply to the the level of the rectus fascia.  The fascia was incised in the midline using the scalpel and then extended using mayo scissors.  The superior border of the rectus fascia was grasped with two Kocher clamps and the underlying rectus muscles were dissected of the fascia using blunt dissection.  The median raphae was incised using Mayo scissors.   The inferior border of the rectus fascia was dissected of the rectus muscles in a similar  fashion.  The midline was identified, the peritoneum was entered bluntly and expanded using manual tractions.  The uterus was noted to be in a none rotated position. Adhesions to bladder reflection taken down with Bovie.  Next the bladder blade was placed retracting the bladder caudally.  A bladder flap was not created.  A low transverse incision was scored on the lower uterine segment.  The hysterotomy was entered bluntly using the operators finger.  The hysterotomy incision was extended using manual traction.  The operators hand was placed within the hysterotomy position noting the fetus to be within the OP position.  The vertex was grasped, flexed, brought to the incision, and delivered a traumatically using fundal pressure.  The remainder of the body delivered with ease.  The infant was suctioned, cord was clamped and cut before handing off to the awaiting neonatologist.  The placenta was delivered using manual extraction.  The uterus was exteriorized, wiped clean of clots and debris using two moist laps.  The hysterotomy was closed using a single layer closure of 0 Vicryl in a running locked fashion.  The uterus was returned to the abdomen.  The peritoneal gutters were wiped clean of clots and debris using two moist laps.  The hysterotomy incision was re-inspected noted to be hemostatic.  Peritoneum closed using a running 2-0 Vicryl stitch.  The rectus muscles were inspected noted to be hemostatic.  The superior border of the rectus fascia was grasped with a Kocher clamp.  The ON-Q trocars were then placed 4cm above the superior border of the incision and tunneled subfascially.  The introducers were removed and the  catheters were threaded through the sleeves after which the sleeves were removed.  The fascia was closed using a looped #1 PDS in a running fashion taking 1cm by 1cm bites.  The subcutaneous tissue was irrigated using warm saline, hemostasis achieved using the bovie.  The subcutaneous dead space  was greater than 3cm and was closed.  The subcutaneous dead space was obliterated by using a 53-T 0 Chromic in a running fashion.  The skin was closed using staples.  Sponge needle and instrument counts were corrects times two.  The patient tolerated the procedure well and was taken to the recovery room in stable condition.

## 2016-06-11 NOTE — H&P (Signed)
Obstetric H&P   Chief Complaint: Leaking fluid  Prenatal Care Provider: [facility]  History of Present Illness: 30 y.o. G3P1011 4216w5d by 6 week US derived EDC of 07/04/2016, with history of prior C-section for breech and oligohydramnios presenting with clear gush of fluid at 0400 this morning.  Has continued to have intermittent gushes since then some light contractions picking up in frequency and intensity.  No vaginal bleeding.  Good fetal movement  Patient with PNC uncomplicated to date.  Patient with born with heart murmur and hole in heart which resolved spontaneously and did not require surgery.  Desires repeat C-section, undecided on contraception declines BTL.  Posterior placenta, H&H 12.8 and 37.5 at 28 weeks.  B pos / ABSC neg / RNI / VZI / HBsAg neg / RPR NR / HIV neg / 1-hr 135 / GBS collected 06/07/16 results not in chart yet  Review of Systems: 10 point review of systems negative unless otherwise noted in HPI  Past Medical History: Past Medical History:  Diagnosis Date  . Anxiety   . Depression     Past Surgical History: Past Surgical History:  Procedure Laterality Date  . CESAREAN SECTION    . TONSILLECTOMY     Family History: History reviewed. No pertinent family history.  Social History: Social History   Social History  . Marital status: Single    Spouse name: N/A  . Number of children: N/A  . Years of education: N/A   Occupational History  . Not on file.   Social History Main Topics  . Smoking status: Never Smoker  . Smokeless tobacco: Never Used  . Alcohol use No  . Drug use: No  . Sexual activity: Yes   Other Topics Concern  . Not on file   Social History Narrative  . No narrative on file    Medications: Prior to Admission medications   Medication Sig Start Date End Date Taking? Authorizing Provider  escitalopram (LEXAPRO) 10 MG tablet Take 10 mg by mouth daily.   Yes Historical Provider, MD  ranitidine (ZANTAC) 150 MG tablet Take 150 mg by  mouth 2 (two) times daily.   Yes Historical Provider, MD  acetaminophen (TYLENOL) 500 MG tablet Take 1,000 mg by mouth every 6 (six) hours as needed.    Historical Provider, MD    Allergies: No Known Allergies  Physical Exam: Vitals: Blood pressure 137/69, pulse 85, temperature 98.3 F (36.8 C), temperature source Oral, resp. rate 19, height 5\' 3"  (1.6 m), weight 86.2 kg (190 lb), last menstrual period 09/28/2015.  Urine Dip Protein: N/A  FHT: 135, moderate, + accels, no decels Toco: q6-657min  General: NAD HEENT: normocephalic, anicteric Pulmonary: no increased work of breathing Cardiovascular: RRR Abdomen: Gravid, non-tender Leopolds: vtx, 6.5lbs Genitourinary: Positive pooling, equivocal Nitrazine, and positive ferning Extremities: no edema  Labs: No results found for this or any previous visit (from the past 24 hour(s)).  Assessment: 30 y.o. G3P1011 3916w5d with SROM, history of prior C-section desiring repeat Plan: 1) SROM - discussed VBAC with patient, VBAC calculator success rate is 64.7%, declines.  Proceed with repeat C-section per patient's wishes.  Pros and cons of VBAC vs C-section discussed in detail  2) Fetus - cat I tracing  3) PNL - B pos / ABSC neg / RNI / VZI / HBsAg neg / RPR NR / HIV neg / 1-hr 135 / GBS collected 06/07/16 results not in chart yet  4) TDAP - 05/10/16  5) Disposition - anticipate discharge POD 2-3

## 2016-06-12 LAB — CBC
HEMATOCRIT: 34.1 % — AB (ref 35.0–47.0)
HEMOGLOBIN: 11.9 g/dL — AB (ref 12.0–16.0)
MCH: 30.3 pg (ref 26.0–34.0)
MCHC: 34.8 g/dL (ref 32.0–36.0)
MCV: 87 fL (ref 80.0–100.0)
Platelets: 204 10*3/uL (ref 150–440)
RBC: 3.93 MIL/uL (ref 3.80–5.20)
RDW: 13.9 % (ref 11.5–14.5)
WBC: 11.5 10*3/uL — AB (ref 3.6–11.0)

## 2016-06-12 LAB — RPR: RPR Ser Ql: NONREACTIVE

## 2016-06-12 MED ORDER — MEASLES, MUMPS & RUBELLA VAC ~~LOC~~ INJ
0.5000 mL | INJECTION | Freq: Once | SUBCUTANEOUS | Status: DC
  Filled 2016-06-12: qty 0.5

## 2016-06-12 NOTE — Anesthesia Post-op Follow-up Note (Signed)
  Anesthesia Pain Follow-up Note  Patient: Brittany [name]  Day #: 1  Date of Follow-up: 06/12/2016 Time: 7:26 AM  Last Vitals:  Vitals:   06/12/16 0125 06/12/16 0311  BP: (!) 108/48 (!) 102/48  Pulse: 79 70  Resp: 18 18  Temp: 36.8 C 36.8 C    Level of Consciousness: alert  Pain: mild   Side Effects:None  Catheter Site Exam: site not evaluated     Plan: D/C from anesthesia care  Clydene PughBeane, Wataru Mccowen D

## 2016-06-12 NOTE — Progress Notes (Signed)
  Post-operative Day 1  Subjective: no complaints, up ad lib, voiding, tolerating PO and + flatus  Objective: Blood pressure (!) 110/54, pulse 78, temperature 98 F (36.7 C), temperature source Oral, resp. rate 20, height _0  (1.6 m), weight 190 lb (86.2 kg), last menstrual period 09/28/2015, SpO2 99 %  Physical Exam:  General: alert and cooperative Lochia: appropriate Uterine Fundus: firm Incision: healing well, no significant drainage DVT Evaluation: No evidence of DVT seen on physical exam. Abdomen: soft, NT   Recent Labs  06/11/16 0616 06/12/16 0638  HGB 12.6 11.9*  HCT 36.8 34.1*    Assessment POD #1  Plan: Continue PO care, Advance activity as tolerated and Discharge POD 2 or 3   Feeding: breast Contraception: Probably Mirena IUD Blood Type: B+ RNI/VI - MMR ordered prior to discharge TDAP UTD    Burlene Arnt, [place] 06/12/2016, 12:02 PM

## 2016-06-12 NOTE — Anesthesia Postprocedure Evaluation (Signed)
Anesthesia Post Note  Patient: Brittany BottomKelli M Hudman  Procedure(s) Performed: Procedure(s) (LRB): REPEAT CESAREAN SECTION (N/A)  Patient location during evaluation: Mother Baby Anesthesia Type: Spinal Level of consciousness: awake and alert and oriented Pain management: pain level controlled Vital Signs Assessment: post-procedure vital signs reviewed and stable Respiratory status: spontaneous breathing Cardiovascular status: stable Postop Assessment: no headache and no signs of nausea or vomiting Anesthetic complications: no    Last Vitals:  Vitals:   06/12/16 0125 06/12/16 0311  BP: (!) 108/48 (!) 102/48  Pulse: 79 70  Resp: 18 18  Temp: 36.8 C 36.8 C    Last Pain:  Vitals:   06/12/16 0534  TempSrc:   PainSc: 2                  Rica MastBachich,  Inaara Tye M

## 2016-06-12 NOTE — Anesthesia Postprocedure Evaluation (Signed)
Anesthesia Post Note  Patient: Brittany [name]  Procedure(s) Performed: Procedure(s) (LRB): REPEAT CESAREAN SECTION (N/A)  Patient location during evaluation: Mother Baby Anesthesia Type: Spinal Level of consciousness: awake and alert and oriented Pain management: satisfactory to patient Vital Signs Assessment: post-procedure vital signs reviewed and stable Respiratory status: respiratory function stable Cardiovascular status: stable Postop Assessment: no headache, no backache, spinal receding, adequate PO intake, no signs of nausea or vomiting and patient able to bend at knees Anesthetic complications: no    Last Vitals:  Vitals:   06/12/16 0125 06/12/16 0311  BP: (!) 108/48 (!) 102/48  Pulse: 79 70  Resp: 18 18  Temp: 36.8 C 36.8 C    Last Pain:  Vitals:   06/12/16 0534  TempSrc:   PainSc: 2                  Clydene PughBeane, Glenette Bookwalter D

## 2016-06-12 NOTE — Anesthesia Post-op Follow-up Note (Incomplete)
  Anesthesia Pain Follow-up Note  Patient: Brittany [name]  Day #: {NUMBER [id number]}1-10:22536}  Date of Follow-up: 06/12/2016 Time: 7:28 AM  Last Vitals:  Vitals:   06/12/16 0125 06/12/16 0311  BP: (!) 108/48 (!) 102/48  Pulse: 79 70  Resp: 18 18  Temp: 36.8 C 36.8 C    Level of Consciousness: {Findings; LOC exam:17915::"alert"}  Pain: {Desc; pain severity:12299}   Side Effects:{CHL AN SIDE EFFECTS:22537}  Catheter Site Exam:{Exam; wound:13612}     Plan: {CHL AN PAIN ASSESSMENT / VWUJ:81191}PLAN:22535}  Clydene PughBeane, Saori Umholtz D

## 2016-06-13 NOTE — Lactation Note (Signed)
This note was copied from a baby's chart. Lactation Consultation Note  Patient Name: Brittany [name]   This LC arrived at end of a breastfeeding session this afternoon. Mom sitting in uncomfortable position, leaning over baby. She said she had some trouble getting baby to nurse on other side. She states baby had been nursing well for 20 minutes and now sleepy and needed to go under bili lights. I encouraged her to call me with next feeding. May need to try pre/post weight checks, but baby is having enough voids and stools, so not critical right now. If LC cannot see her again today, needs one in the morning.   Maternal Data    Feeding Feeding Type: Breast Fed Length of feed: 30 min  LATCH Score/Interventions                      Lactation Tools Discussed/Used     Consult Status      Brittany [name] [name], 6:59 PM

## 2016-06-13 NOTE — Progress Notes (Addendum)
  Subjective:   Pt is progressing well with post op recovery. She is ambulating and voiding without difficulty. She is tolerating PO intake and her pain is well controlled with PO meds and On Q pump. She is only taking tylenol and ibuprofen and prefers to avoid narcotics. She has expressed her frustration and disappointment this am with the pediatricians telling her that baby will not be discharged based on dating and bilirubin status. She is also frustrated with the criteria used to assign her EDD. She is reluctantly agreeable to stay another day and discharge tomorrow with her newborn.  Objective:  Blood pressure 118/67, pulse 70, temperature 98 F (36.7 C), temperature source Oral, resp. rate 18, height 5' 3" (1.6 m), weight 86.2 kg (190 lb), last menstrual period 09/28/2015, SpO2 100 %, breastfeeding.  General: NAD Pulmonary: no increased work of breathing Abdomen: non-distended, non-tender, fundus firm at level of umbilicus Incision: Dressing removed, staples intact, incision site is c/d/i and healing well Extremities: no edema, no erythema, no tenderness   Assessment:   30 y.o. L3Y1017 postoperativeday # 2   Plan:  1) Acute blood loss anemia - hemodynamically stable and asymptomatic - po ferrous sulfate  2) B+, Rubella Not Immune- MMR ordered for administration prior to discharge, Varicella Immune   3) TDAP status: UTD   4) Breast/Contraception: considering Mirena  5) Disposition: probable DC to home day [place], CNM

## 2016-06-14 NOTE — Progress Notes (Signed)
Discharge instr reviewed with pt.  Pt verb u/o.  Staples to be removed at office visit within a wk.  Pt verb u/o of f/u appointment

## 2016-06-14 NOTE — Progress Notes (Signed)
Pt d/c to home with newborn.  To car via w/c

## 2016-06-14 NOTE — Progress Notes (Signed)
Pt declined Rubella vaccine

## 2016-06-14 NOTE — Discharge Instructions (Signed)
Discharge instructions:  ° °Call office if you have any of the following: headache, visual changes, fever >100 F, chills, breast concerns, excessive vaginal bleeding, incision drainage or problems, leg pain or redness, depression or any other concerns.  ° °Activity: Do not lift > 10 lbs for 6 weeks.  °No intercourse or tampons for 6 weeks.  °No driving for 1-2 weeks.  ° °

## 2016-06-14 NOTE — Discharge Summary (Signed)
Obstetric Discharge Summary Reason for Admission: rupture of membranes Delivery Type: repeat cesarean section, low transverse incision Postpartum Procedures: none Complications-Intrapartum or Postpartum: none    Recent Labs  06/12/16 0638  HGB 11.9*  HCT 34.1*      Gestational Age at Delivery: [redacted]w[redacted]d  Antepartum complications: none Date of Delivery: 06/11/16  Delivered By: Dr Staebler   Physical Exam:  General: alert and cooperative Lochia: appropriate Uterine Fundus: firm Incision: healing well, no significant drainage DVT Evaluation: No evidence of DVT seen on physical exam. Abdomen: abdomen is soft without significant tenderness, masses, organomegaly or guarding  Prenatal Labs Blood Type: B+ Rubella: Non-immune, pt declines MMR Varicella: Immune TDAP: Up to date and Given during pregnancy Feeding: Breast, Bottle and Breast and bottle Contraception: probably IUD - will order at office  Discharge Diagnoses: Term Pregnancy-delivered  Discharge Information: Date: 06/14/2016 Activity: pelvic rest Diet: routine Medications: PNV and Tylenol OTC Condition: stable Instructions:  Discharge instructions:   Call office if you have any of the following: headache, visual changes, fever >100 F, chills, breast concerns, excessive vaginal bleeding, incision drainage or problems, leg pain or redness, depression or any other concerns.   Activity: Do not lift > 10 lbs for 6 weeks.  No intercourse or tampons for 6 weeks.  No driving for 1-2 weeks.   Discharge to: home Follow-up Information    STAEBLER, ANDREAS M, MD Follow up in 1 week(s).   Specialty:  Obstetrics and Gynecology Why:  staple removal Contact information: [address] [place] [address] [phone]           Newborn Data: Live born female  Birth Weight: 7 lb 6.9 oz (3370 g) APGAR: 8, 9  Home with mother.  Brothers, Tamara, CNM 06/14/2016, 11:56 AM 

## 2016-06-27 ENCOUNTER — Inpatient Hospital Stay: Admission: RE | Admit: 2016-06-27 | Payer: BLUE CROSS/BLUE SHIELD | Source: Ambulatory Visit

## 2016-10-28 ENCOUNTER — Encounter: Payer: Self-pay | Admitting: Emergency Medicine

## 2016-10-28 ENCOUNTER — Emergency Department
Admission: EM | Admit: 2016-10-28 | Discharge: 2016-10-28 | Disposition: A | Discharge status: Home / Self Care | Payer: BLUE CROSS/BLUE SHIELD | Attending: Emergency Medicine | Admitting: Emergency Medicine

## 2016-10-28 ENCOUNTER — Emergency Department: Payer: BLUE CROSS/BLUE SHIELD

## 2016-10-28 DIAGNOSIS — K8020 Calculus of gallbladder without cholecystitis without obstruction: Secondary | ICD-10-CM | POA: Diagnosis not present

## 2016-10-28 DIAGNOSIS — R1011 Right upper quadrant pain: Secondary | ICD-10-CM

## 2016-10-28 DIAGNOSIS — L02211 Cutaneous abscess of abdominal wall: Secondary | ICD-10-CM | POA: Diagnosis not present

## 2016-10-28 DIAGNOSIS — K651 Peritoneal abscess: Secondary | ICD-10-CM

## 2016-10-28 LAB — CBC
HEMATOCRIT: 40.6 % (ref 35.0–47.0)
HEMOGLOBIN: 14.2 g/dL (ref 12.0–16.0)
MCH: 30 pg (ref 26.0–34.0)
MCHC: 35 g/dL (ref 32.0–36.0)
MCV: 85.9 fL (ref 80.0–100.0)
Platelets: 356 10*3/uL (ref 150–440)
RBC: 4.72 MIL/uL (ref 3.80–5.20)
RDW: 13.3 % (ref 11.5–14.5)
WBC: 9.9 10*3/uL (ref 3.6–11.0)

## 2016-10-28 LAB — URINALYSIS, COMPLETE (UACMP) WITH MICROSCOPIC
Bilirubin Urine: NEGATIVE
Glucose, UA: NEGATIVE mg/dL
HGB URINE DIPSTICK: NEGATIVE
Ketones, ur: 5 mg/dL — AB
Leukocytes, UA: NEGATIVE
NITRITE: NEGATIVE
PROTEIN: 30 mg/dL — AB
Specific Gravity, Urine: 1.024 (ref 1.005–1.030)
pH: 8 (ref 5.0–8.0)

## 2016-10-28 LAB — COMPREHENSIVE METABOLIC PANEL
ALBUMIN: 4.2 g/dL (ref 3.5–5.0)
ALT: 119 U/L — ABNORMAL HIGH (ref 14–54)
ANION GAP: 10 (ref 5–15)
AST: 160 U/L — ABNORMAL HIGH (ref 15–41)
Alkaline Phosphatase: 134 U/L — ABNORMAL HIGH (ref 38–126)
BUN: 12 mg/dL (ref 6–20)
CHLORIDE: 101 mmol/L (ref 101–111)
CO2: 24 mmol/L (ref 22–32)
Calcium: 9.2 mg/dL (ref 8.9–10.3)
Creatinine, Ser: 0.88 mg/dL (ref 0.44–1.00)
GFR calc Af Amer: >60 mL/min (ref >60)
GFR calc non Af Amer: >60 mL/min (ref >60)
GLUCOSE: 183 mg/dL — AB (ref 65–99)
POTASSIUM: 3.7 mmol/L (ref 3.5–5.1)
SODIUM: 135 mmol/L (ref 135–145)
Total Bilirubin: 0.9 mg/dL (ref 0.3–1.2)
Total Protein: 7.5 g/dL (ref 6.5–8.1)

## 2016-10-28 LAB — POCT PREGNANCY, URINE: PREG TEST UR: NEGATIVE

## 2016-10-28 LAB — LIPASE, BLOOD: Lipase: 47 U/L (ref 11–51)

## 2016-10-28 MED ORDER — GI COCKTAIL ~~LOC~~
30.0000 mL | Freq: Once | ORAL | Status: AC
  Administered 2016-10-28: 30 mL via ORAL

## 2016-10-28 MED ORDER — GI COCKTAIL ~~LOC~~
ORAL | Status: AC
  Filled 2016-10-28: qty 30

## 2016-10-28 NOTE — ED Provider Notes (Addendum)
[facility] Emergency Department Provider Note  ____________________________________________  Time seen: Approximately 5:57 AM  I have reviewed the triage vital signs and the nursing notes.   HISTORY  Chief Complaint Abdominal Pain   HPI Brittany [name] is a 31 y.o. female who presents for evaluation of epigastric/right upper quadrant abdominal pain. Patient reports for the last few weeks she has had 5 or 6 episodes of similar pain. The pain is usually a few hours after she eats, it is intense, she describes it as somebody punching her in her stomach, radiating to her back usually lasting up to 30 minutes. Yesterday she had 2 episodes which were more severe each lasting an hour which prompted her to go to an outside hospital. She was found to have mildly elevated LFTs and alkaline phosphatase with tenderness and positive Murphy sign which prompted transfer to [place] for ultrasound to rule out cholecystitis. Patient has had 2 prior C-sections but no other abdominal surgeries. She has had nausea and a few episodes of vomiting with this most recent episode. She also has had chills. She denies any abdominal pain at this time, no nausea, no chills. She denies dysuria or hematuria, diarrhea, chest pain or shortness of breath.  Past Medical History:  Diagnosis Date  . Anxiety   . Depression     Patient Active Problem List   Diagnosis Date Noted  . Amniotic fluid leaking 06/11/2016  . Back pain affecting pregnancy, antepartum 04/16/2016    Past Surgical History:  Procedure Laterality Date  . CESAREAN SECTION    . CESAREAN SECTION N/A 06/11/2016   Procedure: REPEAT CESAREAN SECTION;  Surgeon: Vena Austria, MD;  Location: [facility] ORS;  Service: Obstetrics;  Laterality: N/A;  Female born @ 93 Wt: 7lb 7oz Apgars: 8/9  . TONSILLECTOMY      Prior to Admission medications   Medication Sig Start Date End Date Taking? Authorizing Provider  acetaminophen (TYLENOL) 500 MG  tablet Take 1,000 mg by mouth every 6 (six) hours as needed.    Historical Provider, MD    Allergies Patient has no known allergies.  No family history on file.  Social History Social History  Substance Use Topics  . Smoking status: Never Smoker  . Smokeless tobacco: Never Used  . Alcohol use No    Review of Systems  Constitutional: Negative for fever. Eyes: Negative for visual changes. ENT: Negative for sore throat. Neck: No neck pain  Cardiovascular: Negative for chest pain. Respiratory: Negative for shortness of breath. Gastrointestinal: + RUQ/ epigastric abdominal pain and vomiting. No diarrhea. Genitourinary: Negative for dysuria. Musculoskeletal: Negative for back pain. Skin: Negative for rash. Neurological: Negative for headaches, weakness or numbness. Psych: No SI or HI  ____________________________________________   PHYSICAL EXAM:  VITAL SIGNS: ED Triage Vitals  Enc Vitals Group     BP 10/28/16 0036 119/67     Pulse Rate 10/28/16 0036 86     Resp 10/28/16 0036 18     Temp 10/28/16 0036 97.9 F (36.6 C)     Temp Source 10/28/16 0036 Oral     SpO2 10/28/16 0036 97 %     Weight 10/28/16 0043 170 lb (77.1 kg)     Height 10/28/16 0043 5\' 3"  (1.6 m)     Head Circumference --      Peak Flow --      Pain Score 10/28/16 0043 10     Pain Loc --      Pain Edu? --  Excl. in GC? --     Constitutional: Alert and oriented. Well appearing and in no apparent distress. HEENT:      Head: Normocephalic and atraumatic.         Eyes: Conjunctivae are normal. Sclera is non-icteric. EOMI. PERRL      Mouth/Throat: Mucous membranes are moist.       Neck: Supple with no signs of meningismus. Cardiovascular: Regular rate and rhythm. No murmurs, gallops, or rubs. 2+ symmetrical distal pulses are present in all extremities. No JVD. Respiratory: Normal respiratory effort. Lungs are clear to auscultation bilaterally. No wheezes, crackles, or rhonchi.  Gastrointestinal:  Soft, non tender, and non distended with positive bowel sounds. No rebound or guarding. Genitourinary: No CVA tenderness. Musculoskeletal: Nontender with normal range of motion in all extremities. No edema, cyanosis, or erythema of extremities. Neurologic: Normal speech and language. Face is symmetric. Moving all extremities. No gross focal neurologic deficits are appreciated. Skin: Skin is warm, dry and intact. No rash noted. Psychiatric: Mood and affect are normal. Speech and behavior are normal.  ____________________________________________   LABS (all labs ordered are listed, but only abnormal results are displayed)  Labs Reviewed  COMPREHENSIVE METABOLIC PANEL - Abnormal; Notable for the following:       Result Value   Glucose, Bld 183 (*)    AST 160 (*)    ALT 119 (*)    Alkaline Phosphatase 134 (*)    All other components within normal limits  URINALYSIS, COMPLETE (UACMP) WITH MICROSCOPIC - Abnormal; Notable for the following:    Color, Urine YELLOW (*)    APPearance CLEAR (*)    Ketones, ur 5 (*)    Protein, ur 30 (*)    Bacteria, UA RARE (*)    Squamous Epithelial / LPF 0-5 (*)    All other components within normal limits  LIPASE, BLOOD  CBC  POCT PREGNANCY, URINE   ____________________________________________  EKG  ED ECG REPORT I, Nita Sicklearolina Amauris Debois, the attending physician, personally viewed and interpreted this ECG.  Normal sinus rhythm, rate of 87, normal intervals, normal axis, no ST elevations or depressions, T-wave inversion in lead V2. New from prior ____________________________________________  RADIOLOGY  RUQ US:  Contracted gallbladder with stones present. Mild wall thickening is likely related to contracted state. No definite sonographic evidence for acute cholecystitis. No biliary dilatation. ____________________________________________   PROCEDURES  Procedure(s) performed: None Procedures Critical Care performed:   None ____________________________________________   INITIAL IMPRESSION / ASSESSMENT AND PLAN / ED COURSE  31 y.o. female who presents for evaluation of epigastric/right upper quadrant abdominal pain. Patient has no pain or tenderness at this time. Vital signs are within normal limits. Labs show a mildly elevated LFTs and alkaline phosphatase within normal [place], normal white count, normal lipase. Right upper quadrant ultrasound showing cholelithiasis with no evidence of cholecystitis. Patient be discharged home with referral to surgery for outpatient cholecystectomy. I discussed return precautions including return to the emergency room if the pain returns and lasts more than an hour, if she has fever, multiple episodes of nausea or vomiting, or any new abdominal pain. Patient is comfortable with this plan. Also recommended low-fat diet until she is able to see a Careers advisersurgeon.  Clinical Course     Pertinent labs & imaging results that were available during my care of the patient were reviewed by me and considered in my medical decision making (see chart for details).    ____________________________________________   FINAL CLINICAL IMPRESSION(S) / ED DIAGNOSES  Final diagnoses:  Right upper quadrant abdominal abscess (HCC)  Calculus of gallbladder without cholecystitis without obstruction      NEW MEDICATIONS STARTED DURING THIS VISIT:  New Prescriptions   No medications on file     Note:  This document was prepared using Dragon voice recognition software and may include unintentional dictation errors.    Nita Sickle, MD 10/28/16 0602    Nita Sickle, MD 10/28/16 [phone]

## 2016-10-28 NOTE — ED Triage Notes (Signed)
Pt arrives from [place] with c/o of upper abdominal pain that radiates around her back. Pt is ambulatory to [facility] and pt was dc'd with it. Pt reports that she has had these symptoms 5-6 times over the last few weeks. Pt is NAD at this time.

## 2016-10-28 NOTE — ED Notes (Signed)
Resting quietly at this time.  Reports feeling some better.

## 2016-10-29 ENCOUNTER — Other Ambulatory Visit: Payer: Self-pay

## 2016-10-30 ENCOUNTER — Encounter: Payer: Self-pay | Admitting: General Surgery

## 2016-10-30 ENCOUNTER — Ambulatory Visit (INDEPENDENT_AMBULATORY_CARE_PROVIDER_SITE_OTHER): Payer: BLUE CROSS/BLUE SHIELD | Admitting: General Surgery

## 2016-10-30 VITALS — BP 128/77 | HR 97 | Temp 98.3°F | Ht 64.0 in | Wt 177.0 lb

## 2016-10-30 DIAGNOSIS — K8020 Calculus of gallbladder without cholecystitis without obstruction: Secondary | ICD-10-CM | POA: Diagnosis not present

## 2016-10-30 NOTE — Patient Instructions (Signed)
Please look at your surgery sheet in case you have any questions about your surgery.  Remember to request for two weeks off from work but if you feel better within a week, please let us know so we could give you a disability certificate to release you if needed.  Laparoscopic Cholecystectomy, Care After This sheet gives you information about how to care for yourself after your procedure. Your doctor may also give you more specific instructions. If you have problems or questions, contact your doctor. Follow these instructions at home: Care for cuts from surgery (incisions)   Follow instructions from your doctor about how to take care of your cuts from surgery. Make sure you:  Wash your hands with soap and water before you change your bandage (dressing). If you cannot use soap and water, use hand sanitizer.  Change your bandage as told by your doctor.  Leave stitches (sutures), skin glue, or skin tape (adhesive) strips in place. They may need to stay in place for 2 weeks or longer. If tape strips get loose and curl up, you may trim the loose edges. Do not remove tape strips completely unless your doctor says it is okay.  Do not take baths, swim, or use a hot tub until your doctor says it is okay. Ask your doctor if you can take showers. You may only be allowed to take sponge baths for bathing.  Check your surgical cut area every day for signs of infection. Check for:  More redness, swelling, or pain.  More fluid or blood.  Warmth.  Pus or a bad smell. Activity  Do not drive or use heavy machinery while taking prescription pain medicine.  Do not lift anything that is heavier than 10 lb (4.5 kg) until your doctor says it is okay.  Do not play contact sports until your doctor says it is okay.  Do not drive for 24 hours if you were given a medicine to help you relax (sedative).  Rest as needed. Do not return to work or school until your doctor says it is okay. General  instructions  Take over-the-counter and prescription medicines only as told by your doctor.  To prevent or treat constipation while you are taking prescription pain medicine, your doctor may recommend that you:  Drink enough fluid to keep your pee (urine) clear or pale yellow.  Take over-the-counter or prescription medicines.  Eat foods that are high in fiber, such as fresh fruits and vegetables, whole grains, and beans.  Limit foods that are high in fat and processed sugars, such as fried and sweet foods. Contact a doctor if:  You develop a rash.  You have more redness, swelling, or pain around your surgical cuts.  You have more fluid or blood coming from your surgical cuts.  Your surgical cuts feel warm to the touch.  You have pus or a bad smell coming from your surgical cuts.  You have a fever.  One or more of your surgical cuts breaks open. Get help right away if:  You have trouble breathing.  You have chest pain.  You have pain that is getting worse in your shoulders.  You faint or feel dizzy when you stand.  You have very bad pain in your belly (abdomen).  You are sick to your stomach (nauseous) for more than one day.  You have throwing up (vomiting) that lasts for more than one day.  You have leg pain. This information is not intended to replace advice given to  you by your health care provider. Make sure you discuss any questions you have with your health care provider. Document Released: 07/17/2008 Document Revised: 04/28/2016 Document Reviewed: 03/26/2016 Elsevier Interactive Patient Education  2017 [employer]

## 2016-10-30 NOTE — Progress Notes (Signed)
Patient ID: Brittany [name], female   DOB: 07/11/1986, 31 y.o.   MRN: [id number]  CC: Abdominal Pain  HPI Brittany [name] is a 31 y.o. female who presents to clinic for evaluation of abdominal pain. Patient states that she's been seen in the ER for her abdominal pain as recently as this past weekend. The pain started after eating party food at a baby shower. She's had at least 5 or 6 episodes in the past all after eating what she describes as unhealthy foods. Pain usually lasts for about 30 minutes were recurs but over the weekend it lasted for almost 2 hours prompting her to go to the ER. It is always in her midepigastrium and feels like it shoots through to her back. She had some subjective fevers and chills over the weekend but none documented in the chart. She had some self-induced nausea and vomiting thinking that it would help with her pain but it did not affect. She denies any chest pain, shortness of breath, diarrhea, constipation. She is otherwise in her usual state of good health and has a breast-feeding child.  HPI  Past Medical History:  Diagnosis Date  . Anxiety   . Depression     Past Surgical History:  Procedure Laterality Date  . CESAREAN SECTION N/A 02/2009  . CESAREAN SECTION N/A 06/11/2016   Procedure: REPEAT CESAREAN SECTION;  Surgeon: Andreas Staebler, MD;  Location: [facility] ORS;  Service: Obstetrics;  Laterality: N/A;  Female born @ 0755 Wt: 7lb 7oz Apgars: 8/9  . TONSILLECTOMY      Family History  Problem Relation Age of Onset  . Thrombosis Mother   . Healthy Father   . Cancer Maternal Grandmother     breast  . Cancer Paternal Grandfather     colon    Social History Social History  Substance Use Topics  . Smoking status: Never Smoker  . Smokeless tobacco: Never Used  . Alcohol use No    Allergies  Allergen Reactions  . Clindamycin Shortness Of Breath  . Codeine Nausea Only    No current outpatient prescriptions on file.   No current facility-administered  medications for this visit.      Review of Systems A Multi-point review of systems was asked and was negative except for the findings documented in the history of present illness  Physical Exam vitals: Blood pressure 128/77, heart rate 97, temperature 98.3 unknown if currently breastfeeding. CONSTITUTIONAL: No acute distress. EYES: Pupils are equal, round, and reactive to light, Sclera are non-icteric. EARS, NOSE, MOUTH AND THROAT: The oropharynx is clear. The oral mucosa is pink and moist. Hearing is intact to voice. LYMPH NODES:  Lymph nodes in the neck are normal. RESPIRATORY:  Lungs are clear. There is normal respiratory effort, with equal breath sounds bilaterally, and without pathologic use of accessory muscles. CARDIOVASCULAR: Heart is regular without murmurs, gallops, or rubs. GI: The abdomen is soft, nontender, and nondistended. There are no palpable masses. There is no hepatosplenomegaly. There are normal bowel sounds in all quadrants. GU: Rectal deferred.   MUSCULOSKELETAL: Normal muscle strength and tone. No cyanosis or edema.   SKIN: Turgor is good and there are no pathologic skin lesions or ulcers. NEUROLOGIC: Motor and sensation is grossly normal. Cranial nerves are grossly intact. PSYCH:  Oriented to person, place and time. Affect is normal.  Data Reviewed Images and labs reviewed. Labs are primarily in normal limits the white blood cell count 9.9 and a bilirubin of 0.9. There is a   mild elevated transaminase of 160 for AST and 119 for ALT. Alk phosphatase is also mildly elevated at 134. Ultrasound the abdomen shows multiple gallstones within a contracted gallbladder. No evidence of a lateral thickening, pericolic cystic fluid, ductal dilatation. I have personally reviewed the patient's imaging, laboratory findings and medical records.    Assessment    Symptomatic cholelithiasis    Plan    31-year-old female with a history and physical consistent with symptomatic  cholelithiasis. Discussed the diagnosis in detail including the treatment options that would include a laparoscopic cholecystectomy. I discussed the procedure in detail.  The patient was given educational material.  We discussed the risks and benefits of a laparoscopic cholecystectomy and possible cholangiogram including, but not limited to bleeding, infection, injury to surrounding structures such as the intestine or liver, bile leak, retained gallstones, need to convert to an open procedure, prolonged diarrhea, blood clots such as  DVT, common bile duct injury, anesthesia risks, and possible need for additional procedures.  The likelihood of improvement in symptoms and return to the patient's normal status is good. We discussed the typical post-operative recovery course. Also discussed the need to refrain from using her breast milk while recovering from surgery until the anesthetic and narcotics her out of her system. Patient voiced understanding and desires to proceed. We will plan for surgery on Friday, January 26.      Time spent with the patient was 45 minutes, with more than 50% of the time spent in face-to-face education, counseling and care coordination.     Tyisha Cressy, MD FACS General Surgeon 10/30/2016, 2:26 PM    

## 2016-11-01 ENCOUNTER — Telehealth: Payer: Self-pay | Admitting: General Surgery

## 2016-11-01 NOTE — Telephone Encounter (Signed)
Pt advised of pre op date/time and sx date. Sx: 11/16/16 with Dr Devoria AlbeWoodham--Laparoscopic cholecystectomy.  Pre op: 11/09/16 between 9-1:00pm--[facility].   Patient made aware to call [phone], between 1-3:00pm the day before surgery, to find out what time to arrive.     Patient has agreed to pay a deposit of 250.00 prior to surgery.

## 2016-11-09 ENCOUNTER — Encounter
Admission: RE | Admit: 2016-11-09 | Discharge: 2016-11-09 | Disposition: A | Discharge status: Home / Self Care | Payer: BLUE CROSS/BLUE SHIELD | Source: Ambulatory Visit | Attending: General Surgery | Admitting: General Surgery

## 2016-11-09 HISTORY — DX: Personal history of Methicillin resistant Staphylococcus aureus infection: Z86.14

## 2016-11-09 HISTORY — DX: Other complications of anesthesia, initial encounter: T88.59XA

## 2016-11-09 HISTORY — DX: Cardiac murmur, unspecified: R01.1

## 2016-11-09 HISTORY — DX: Nausea with vomiting, unspecified: R11.2

## 2016-11-09 HISTORY — DX: Headache: R51

## 2016-11-09 HISTORY — DX: Gastro-esophageal reflux disease without esophagitis: K21.9

## 2016-11-09 HISTORY — DX: Adverse effect of unspecified anesthetic, initial encounter: T41.45XA

## 2016-11-09 HISTORY — DX: Headache, unspecified: R51.9

## 2016-11-09 HISTORY — DX: Other specified postprocedural states: Z98.890

## 2016-11-09 NOTE — Patient Instructions (Signed)
  Your procedure is scheduled on: 11-16-16 Report to Same Day Surgery 2nd floor medical mall [facility](Medical Mall Entrance-take elevator on left to 2nd floor.  Check in with surgery information desk.) To find out your arrival time please call [phone] between 1PM - 3PM on 11-15-16  Remember: Instructions that are not followed completely may result in serious medical risk, up to and including death, or upon the discretion of your surgeon and anesthesiologist your surgery may need to be rescheduled.    _x___ 1. Do not eat food or drink liquids after midnight. No gum chewing or hard candies.     __x__ 2. No Alcohol for 24 hours before or after surgery.   __x__3. No Smoking for 24 prior to surgery.   ____  4. Bring all medications with you on the day of surgery if instructed.    __x__ 5. Notify your doctor if there is any change in your medical condition     (cold, fever, infections).     Do not wear jewelry, make-up, hairpins, clips or nail polish.  Do not wear lotions, powders, or perfumes. You may wear deodorant.  Do not shave 48 hours prior to surgery. Men may shave face and neck.  Do not bring valuables to the hospital.    [facility] is not responsible for any belongings or valuables.               Contacts, dentures or bridgework may not be worn into surgery.  Leave your suitcase in the car. After surgery it may be brought to your room.  For patients admitted to the hospital, discharge time is determined by your treatment team.   Patients discharged the day of surgery will not be allowed to drive home.  You will need someone to drive you home and stay with you the night of your procedure.    Please read over the following fact sheets that you were given:   [facility] Preparing for Surgery and or MRSA Information   ____ Take these medicines the morning of surgery with A SIP OF WATER:    1. NONE  2.  3.  4.  5.  6.  ____Fleets enema or Magnesium Citrate as directed.   ____  Use CHG Soap or sage wipes as directed on instruction sheet   ____ Use inhalers on the day of surgery and bring to hospital day of surgery  ____ Stop metformin 2 days prior to surgery    ____ Take 1/2 of usual insulin dose the night before surgery and none on the morning of surgery.   ____ Stop Aspirin, Coumadin, Pllavix ,Eliquis, Effient, or Pradaxa  x__ Stop Anti-inflammatories such as Advil, Aleve, Ibuprofen, Motrin, Naproxen,          Naprosyn, Goodies powders or aspirin products NOW-Ok to take Tylenol.   ____ Stop supplements until after surgery.    ____ Bring C-Pap to the hospital.

## 2016-11-15 ENCOUNTER — Encounter: Payer: Self-pay | Admitting: *Deleted

## 2016-11-15 MED ORDER — CEFOXITIN SODIUM 1 G IV SOLR
1.0000 g | INTRAVENOUS | Status: AC
  Administered 2016-11-16: 1 g via INTRAVENOUS
  Filled 2016-11-15: qty 1

## 2016-11-16 ENCOUNTER — Ambulatory Visit: Payer: BLUE CROSS/BLUE SHIELD | Admitting: Anesthesiology

## 2016-11-16 ENCOUNTER — Ambulatory Visit: Payer: BLUE CROSS/BLUE SHIELD

## 2016-11-16 ENCOUNTER — Ambulatory Visit
Admission: RE | Admit: 2016-11-16 | Discharge: 2016-11-16 | Disposition: A | Discharge status: Home / Self Care | Payer: BLUE CROSS/BLUE SHIELD | Source: Ambulatory Visit | Attending: General Surgery | Admitting: General Surgery

## 2016-11-16 ENCOUNTER — Encounter
Admission: RE | Disposition: A | Discharge status: Home / Self Care | Payer: Self-pay | Source: Ambulatory Visit | Attending: General Surgery

## 2016-11-16 ENCOUNTER — Encounter: Payer: Self-pay | Admitting: *Deleted

## 2016-11-16 DIAGNOSIS — K8010 Calculus of gallbladder with chronic cholecystitis without obstruction: Secondary | ICD-10-CM | POA: Insufficient documentation

## 2016-11-16 DIAGNOSIS — Z87891 Personal history of nicotine dependence: Secondary | ICD-10-CM | POA: Diagnosis not present

## 2016-11-16 DIAGNOSIS — Z419 Encounter for procedure for purposes other than remedying health state, unspecified: Secondary | ICD-10-CM

## 2016-11-16 HISTORY — PX: CHOLECYSTECTOMY: SHX55

## 2016-11-16 LAB — SURGICAL PCR SCREEN
MRSA, PCR: NEGATIVE
STAPHYLOCOCCUS AUREUS: POSITIVE — AB

## 2016-11-16 LAB — POCT PREGNANCY, URINE: Preg Test, Ur: NEGATIVE

## 2016-11-16 SURGERY — LAPAROSCOPIC CHOLECYSTECTOMY
Anesthesia: General | Wound class: Clean Contaminated

## 2016-11-16 MED ORDER — ACETAMINOPHEN 10 MG/ML IV SOLN
INTRAVENOUS | Status: AC
  Filled 2016-11-16: qty 100

## 2016-11-16 MED ORDER — FAMOTIDINE 20 MG PO TABS
ORAL_TABLET | ORAL | Status: AC
  Filled 2016-11-16: qty 1

## 2016-11-16 MED ORDER — LACTATED RINGERS IV SOLN
INTRAVENOUS | Status: DC
  Administered 2016-11-16: 11:00:00 via INTRAVENOUS

## 2016-11-16 MED ORDER — ONDANSETRON HCL 4 MG/2ML IJ SOLN
INTRAMUSCULAR | Status: DC | PRN
  Administered 2016-11-16: 4 mg via INTRAVENOUS

## 2016-11-16 MED ORDER — LIDOCAINE HCL (CARDIAC) 20 MG/ML IV SOLN
INTRAVENOUS | Status: DC | PRN
Start: 2016-11-16 — End: 2016-11-16
  Administered 2016-11-16: 100 mg via INTRAVENOUS

## 2016-11-16 MED ORDER — PROPOFOL 10 MG/ML IV BOLUS
INTRAVENOUS | Status: DC | PRN
  Administered 2016-11-16: 140 mg via INTRAVENOUS

## 2016-11-16 MED ORDER — SUGAMMADEX SODIUM 200 MG/2ML IV SOLN
INTRAVENOUS | Status: AC
  Filled 2016-11-16: qty 2

## 2016-11-16 MED ORDER — DEXAMETHASONE SODIUM PHOSPHATE 10 MG/ML IJ SOLN
INTRAMUSCULAR | Status: DC | PRN
  Administered 2016-11-16: 10 mg via INTRAVENOUS

## 2016-11-16 MED ORDER — NEOSTIGMINE METHYLSULFATE 10 MG/10ML IV SOLN
INTRAVENOUS | Status: DC | PRN
  Administered 2016-11-16: 3 mg via INTRAVENOUS

## 2016-11-16 MED ORDER — FENTANYL CITRATE (PF) 100 MCG/2ML IJ SOLN
25.0000 ug | INTRAMUSCULAR | Status: DC | PRN
  Administered 2016-11-16 (×2): 25 ug via INTRAVENOUS

## 2016-11-16 MED ORDER — KETOROLAC TROMETHAMINE 30 MG/ML IJ SOLN
INTRAMUSCULAR | Status: AC
  Filled 2016-11-16: qty 1

## 2016-11-16 MED ORDER — PROMETHAZINE HCL 25 MG/ML IJ SOLN
6.2500 mg | INTRAMUSCULAR | Status: DC | PRN
  Administered 2016-11-16: 6.25 mg via INTRAVENOUS

## 2016-11-16 MED ORDER — FENTANYL CITRATE (PF) 100 MCG/2ML IJ SOLN
INTRAMUSCULAR | Status: DC | PRN
  Administered 2016-11-16: 100 ug via INTRAVENOUS

## 2016-11-16 MED ORDER — ROCURONIUM BROMIDE 100 MG/10ML IV SOLN
INTRAVENOUS | Status: DC | PRN
  Administered 2016-11-16: 50 mg via INTRAVENOUS

## 2016-11-16 MED ORDER — FENTANYL CITRATE (PF) 250 MCG/5ML IJ SOLN
INTRAMUSCULAR | Status: AC
  Filled 2016-11-16: qty 5

## 2016-11-16 MED ORDER — MIDAZOLAM HCL 2 MG/2ML IJ SOLN
INTRAMUSCULAR | Status: DC | PRN
  Administered 2016-11-16: 2 mg via INTRAVENOUS

## 2016-11-16 MED ORDER — OXYCODONE-ACETAMINOPHEN 5-325 MG PO TABS: 1.0000 | ORAL_TABLET | ORAL | 0 refills | Status: DC | PRN

## 2016-11-16 MED ORDER — GLYCOPYRROLATE 0.2 MG/ML IJ SOLN
INTRAMUSCULAR | Status: AC
  Filled 2016-11-16: qty 1

## 2016-11-16 MED ORDER — SODIUM CHLORIDE 0.9 % IJ SOLN
INTRAMUSCULAR | Status: AC
  Filled 2016-11-16: qty 10

## 2016-11-16 MED ORDER — MIDAZOLAM HCL 2 MG/2ML IJ SOLN
INTRAMUSCULAR | Status: AC
  Filled 2016-11-16: qty 2

## 2016-11-16 MED ORDER — LIDOCAINE HCL 1 % IJ SOLN
INTRAMUSCULAR | Status: DC | PRN
  Administered 2016-11-16: 26 mL

## 2016-11-16 MED ORDER — ROCURONIUM BROMIDE 50 MG/5ML IV SOSY
PREFILLED_SYRINGE | INTRAVENOUS | Status: AC
  Filled 2016-11-16: qty 5

## 2016-11-16 MED ORDER — LIDOCAINE HCL (PF) 1 % IJ SOLN
INTRAMUSCULAR | Status: AC
  Filled 2016-11-16: qty 30

## 2016-11-16 MED ORDER — CHLORHEXIDINE GLUCONATE CLOTH 2 % EX PADS: 6.0000 | MEDICATED_PAD | Freq: Once | CUTANEOUS | Status: DC

## 2016-11-16 MED ORDER — BUPIVACAINE HCL (PF) 0.5 % IJ SOLN
INTRAMUSCULAR | Status: AC
  Filled 2016-11-16: qty 30

## 2016-11-16 MED ORDER — GLYCOPYRROLATE 0.2 MG/ML IJ SOLN
INTRAMUSCULAR | Status: DC | PRN
  Administered 2016-11-16: .6 mg via INTRAVENOUS

## 2016-11-16 MED ORDER — ACETAMINOPHEN 10 MG/ML IV SOLN
INTRAVENOUS | Status: DC | PRN
  Administered 2016-11-16: 1000 mg via INTRAVENOUS

## 2016-11-16 MED ORDER — FAMOTIDINE 20 MG PO TABS
20.0000 mg | ORAL_TABLET | Freq: Once | ORAL | Status: AC
  Administered 2016-11-16: 20 mg via ORAL

## 2016-11-16 MED ORDER — IOTHALAMATE MEGLUMINE 60 % INJ SOLN
INTRAMUSCULAR | Status: DC | PRN
  Administered 2016-11-16: 7.5 mL via SURGICAL_CAVITY

## 2016-11-16 MED ORDER — PROPOFOL 10 MG/ML IV BOLUS
INTRAVENOUS | Status: AC
  Filled 2016-11-16: qty 20

## 2016-11-16 MED ORDER — FENTANYL CITRATE (PF) 100 MCG/2ML IJ SOLN
INTRAMUSCULAR | Status: AC
  Administered 2016-11-16: 25 ug via INTRAVENOUS
  Filled 2016-11-16: qty 2

## 2016-11-16 MED ORDER — ONDANSETRON HCL 4 MG/2ML IJ SOLN
INTRAMUSCULAR | Status: AC
  Filled 2016-11-16: qty 2

## 2016-11-16 MED ORDER — PROMETHAZINE HCL 25 MG/ML IJ SOLN
INTRAMUSCULAR | Status: AC
  Filled 2016-11-16: qty 1

## 2016-11-16 SURGICAL SUPPLY — 47 items
ADH LQ OCL WTPRF AMP STRL LF (MISCELLANEOUS) ×1
ADHESIVE MASTISOL STRL (MISCELLANEOUS) ×3 IMPLANT
APPLIER CLIP ROT 10 11.4 M/L (STAPLE) ×3
APR CLP MED LRG 11.4X10 (STAPLE) ×1
BAG COUNTER SPONGE EZ (MISCELLANEOUS) ×1 IMPLANT
BAG SPNG 4X4 CLR HAZ (MISCELLANEOUS)
BLADE SURG SZ11 CARB STEEL (BLADE) ×5 IMPLANT
CANISTER SUCT 1200ML W/VALVE (MISCELLANEOUS) ×3 IMPLANT
CATH CHOLANG 76X19 KUMAR (CATHETERS) ×3 IMPLANT
CHLORAPREP W/TINT 26ML (MISCELLANEOUS) ×3 IMPLANT
CLIP APPLIE ROT 10 11.4 M/L (STAPLE) ×1 IMPLANT
CLOSURE WOUND 1/2 X4 (GAUZE/BANDAGES/DRESSINGS) ×1
CONRAY 60ML FOR OR (MISCELLANEOUS) ×2 IMPLANT
COUNTER SPONGE BAG EZ (MISCELLANEOUS)
DRAPE SHEET LG 3/4 BI-LAMINATE (DRAPES) ×3 IMPLANT
DRESSING TELFA 4X3 1S ST N-ADH (GAUZE/BANDAGES/DRESSINGS) ×3 IMPLANT
DRSG TEGADERM 2-3/8X2-3/4 SM (GAUZE/BANDAGES/DRESSINGS) ×12 IMPLANT
ELECT REM PT RETURN 9FT ADLT (ELECTROSURGICAL) ×3
ELECTRODE REM PT RTRN 9FT ADLT (ELECTROSURGICAL) ×1 IMPLANT
GLOVE BIO SURGEON STRL SZ7.5 (GLOVE) ×9 IMPLANT
GLOVE INDICATOR 8.0 STRL GRN (GLOVE) ×3 IMPLANT
GOWN STRL REUS W/ TWL LRG LVL3 (GOWN DISPOSABLE) ×3 IMPLANT
GOWN STRL REUS W/TWL LRG LVL3 (GOWN DISPOSABLE) ×18
GRASPER SUT TROCAR 14GX15 (MISCELLANEOUS) ×3 IMPLANT
IRRIGATION STRYKERFLOW (MISCELLANEOUS) IMPLANT
IRRIGATOR STRYKERFLOW (MISCELLANEOUS) ×3
IV NS 1000ML (IV SOLUTION)
IV NS 1000ML BAXH (IV SOLUTION) IMPLANT
L-HOOK LAP DISP 36CM (ELECTROSURGICAL) ×3
LABEL OR SOLS (LABEL) ×3 IMPLANT
LHOOK LAP DISP 36CM (ELECTROSURGICAL) ×1 IMPLANT
NDL HYPO 25X1 1.5 SAFETY (NEEDLE) ×1 IMPLANT
NEEDLE HYPO 25X1 1.5 SAFETY (NEEDLE) ×3 IMPLANT
NEEDLE VERESS 14GA 120MM (NEEDLE) ×3 IMPLANT
NS IRRIG 500ML POUR BTL (IV SOLUTION) ×3 IMPLANT
PACK LAP CHOLECYSTECTOMY (MISCELLANEOUS) ×3 IMPLANT
PENCIL ELECTRO HAND CTR (MISCELLANEOUS) ×3 IMPLANT
POUCH ENDO CATCH 10MM SPEC (MISCELLANEOUS) ×3 IMPLANT
SCISSORS METZENBAUM CVD 33 (INSTRUMENTS) ×3 IMPLANT
SLEEVE ENDOPATH XCEL 5M (ENDOMECHANICALS) ×6 IMPLANT
STRIP CLOSURE SKIN 1/2X4 (GAUZE/BANDAGES/DRESSINGS) ×1 IMPLANT
SUT MNCRL 4-0 (SUTURE) ×6
SUT MNCRL 4-0 27XMFL (SUTURE) ×2
SUTURE MNCRL 4-0 27XMF (SUTURE) ×1 IMPLANT
TROCAR XCEL 12X100 BLDLESS (ENDOMECHANICALS) ×3 IMPLANT
TROCAR XCEL NON-BLD 5MMX100MML (ENDOMECHANICALS) ×3 IMPLANT
TUBING INSUFFLATOR HI FLOW (MISCELLANEOUS) ×3 IMPLANT

## 2016-11-16 NOTE — Discharge Instructions (Signed)
Laparoscopic Cholecystectomy, Care After This sheet gives you information about how to care for yourself after your procedure. Your health care provider may also give you more specific instructions. If you have problems or questions, contact your health care provider. What can I expect after the procedure? After the procedure, it is common to have:  Pain at your incision sites. You will be given medicines to control this pain.  Mild nausea or vomiting.  Bloating and possible shoulder pain from the air-like gas that was used during the procedure. Follow these instructions at home: Incision care  Follow instructions from your health care provider about how to take care of your incisions. Make sure you:  Wash your hands with soap and water before you change your bandage (dressing). If soap and water are not available, use hand sanitizer.  Change your dressing as told by your health care provider. Change after showers every day with large Band-Aids until there is no drainage present on the bandage.  Leave stitches (sutures), skin glue, or adhesive strips in place. These skin closures may need to be in place for 2 weeks or longer. If adhesive strip edges start to loosen and curl up, you may trim the loose edges. Do not remove adhesive strips completely unless your health care provider tells you to do that.  Do not take baths, swim, or use a hot tub until your health care provider approves. Ask your health care provider if you can take showers. You may only be allowed to take sponge baths for bathing. Okay to shower 24 hours after surgery.  Check your incision area every day for signs of infection. Check for:  More redness, swelling, or pain.  More fluid or blood.  Warmth.  Pus or a bad smell. Activity  Do not drive or use heavy machinery while taking prescription pain medicine.  Do not lift anything that is heavier than 10 lb (4.5 kg) until your health care provider approves.  Do not  play contact sports until your health care provider approves.  Do not drive for 24 hours if you were given a medicine to help you relax (sedative).  Rest as needed. Do not return to work or school until your health care provider approves.  Do not user breastmilk for 24 hours after general anesthesia. Also advised to pump and dump for at least 6 hours after using oral narcotics. General instructions  Take over-the-counter and prescription medicines only as told by your health care provider.  To prevent or treat constipation while you are taking prescription pain medicine, your health care provider may recommend that you:  Drink enough fluid to keep your urine clear or pale yellow.  Take over-the-counter or prescription medicines.  Eat foods that are high in fiber, such as fresh fruits and vegetables, whole grains, and beans.  Limit foods that are high in fat and processed sugars, such as fried and sweet foods. Contact a health care provider if:  You develop a rash.  You have more redness, swelling, or pain around your incisions.  You have more fluid or blood coming from your incisions.  Your incisions feel warm to the touch.  You have pus or a bad smell coming from your incisions.  You have a fever.  One or more of your incisions breaks open. Get help right away if:  You have trouble breathing.  You have chest pain.  You have increasing pain in your shoulders.  You faint or feel dizzy when you stand.  You have severe pain in your abdomen.  You have nausea or vomiting that lasts for more than one day.  You have leg pain. This information is not intended to replace advice given to you by your health care provider. Make sure you discuss any questions you have with your health care provider. Document Released: 10/08/2005 Document Revised: 04/28/2016 Document Reviewed: 03/26/2016 Elsevier Interactive Patient Education  2017 [employer]  General Anesthesia, Adult,  Care After These instructions provide you with information about caring for yourself after your procedure. Your health care provider may also give you more specific instructions. Your treatment has been planned according to current medical practices, but problems sometimes occur. Call your health care provider if you have any problems or questions after your procedure. What can I expect after the procedure? After the procedure, it is common to have:  Vomiting.  A sore throat.  Mental slowness. It is common to feel:  Nauseous.  Cold or shivery.  Sleepy.  Tired.  Sore or achy, even in parts of your body where you did not have surgery. Follow these instructions at home: For at least 24 hours after the procedure:  Do not:  Participate in activities where you could fall or become injured.  Drive.  Use heavy machinery.  Drink alcohol.  Take sleeping pills or medicines that cause drowsiness.  Make important decisions or sign legal documents.  Take care of children on your own.  Rest. Eating and drinking  If you vomit, drink water, juice, or soup when you can drink without vomiting.  Drink enough fluid to keep your urine clear or pale yellow.  Make sure you have little or no nausea before eating solid foods.  Follow the diet recommended by your health care provider. General instructions  Have a responsible adult stay with you until you are awake and alert.  Return to your normal activities as told by your health care provider. Ask your health care provider what activities are safe for you.  Take over-the-counter and prescription medicines only as told by your health care provider.  If you smoke, do not smoke without supervision.  Keep all follow-up visits as told by your health care provider. This is important. Contact a health care provider if:  You continue to have nausea or vomiting at home, and medicines are not helpful.  You cannot drink fluids or start  eating again.  You cannot urinate after 8-12 hours.  You develop a skin rash.  You have fever.  You have increasing redness at the site of your procedure. Get help right away if:  You have difficulty breathing.  You have chest pain.  You have unexpected bleeding.  You feel that you are having a life-threatening or urgent problem. This information is not intended to replace advice given to you by your health care provider. Make sure you discuss any questions you have with your health care provider. Document Released: 01/14/2001 Document Revised: 03/12/2016 Document Reviewed: 09/22/2015 Elsevier Interactive Patient Education  2017 [employer].

## 2016-11-16 NOTE — Progress Notes (Signed)
Phenergan IV given at 1346 for c/o nausea. Patient states no further nausea at this time.

## 2016-11-16 NOTE — Anesthesia Procedure Notes (Signed)
Procedure Name: Intubation Date/Time: 11/16/2016 11:36 AM Performed by: [facility] Pre-anesthesia Checklist: Patient identified, Emergency Drugs available, Suction available and Patient being monitored Patient Re-evaluated:Patient Re-evaluated prior to inductionOxygen Delivery Method: Circle system utilized Preoxygenation: Pre-oxygenation with 100% oxygen Intubation Type: IV induction Ventilation: Mask ventilation without difficulty Laryngoscope Size: Mac and 3 Grade View: Grade I Tube type: Oral Tube size: 7.0 mm Number of attempts: 1 Airway Equipment and Method: Patient positioned with wedge pillow and Stylet Placement Confirmation: ETT inserted through vocal cords under direct vision,  positive ETCO2 and breath sounds checked- equal and bilateral Secured at: 21 cm Tube secured with: Tape Dental Injury: Teeth and Oropharynx as per pre-operative assessment

## 2016-11-16 NOTE — Anesthesia Preprocedure Evaluation (Signed)
Anesthesia Evaluation  Patient identified by MRN, date of birth, ID band Patient awake    Reviewed: Allergy & Precautions, H&P , NPO status , Patient's Chart, lab work & pertinent test results  History of Anesthesia Complications (+) PONV and history of anesthetic complications  Airway Mallampati: III  TM Distance: >3 FB Neck ROM: full    Dental no notable dental hx. (+) Poor Dentition, Chipped   Pulmonary neg shortness of breath, former smoker,    Pulmonary exam normal breath sounds clear to auscultation       Cardiovascular Exercise Tolerance: Good (-) angina(-) Past MI and (-) DOE Normal cardiovascular exam Rhythm:regular Rate:Normal     Neuro/Psych  Headaches, PSYCHIATRIC DISORDERS Anxiety Depression    GI/Hepatic negative GI ROS, Neg liver ROS, GERD  Controlled and Medicated,  Endo/Other  negative endocrine ROS  Renal/GU      Musculoskeletal   Abdominal   Peds  Hematology negative hematology ROS (+)   Anesthesia Other Findings Past Medical History: No date: Anxiety No date: Complication of anesthesia No date: Depression No date: GERD (gastroesophageal reflux disease) No date: Headache     Comment: H/O MIGRAINES No date: Heart murmur     Comment: AS A CHILD-PT STATES SHE HAS OUTGROWN MURMUR               AND NEVER HAD A PROBLEM WITH IT AS A CHILD 2008: History of methicillin resistant staphylococcu*     Comment: LEG No date: PONV (postoperative nausea and vomiting)  Past Surgical History: 02/2009: CESAREAN SECTION N/A 06/11/2016: CESAREAN SECTION N/A     Comment: Procedure: REPEAT CESAREAN SECTION;  Surgeon:               Vena AustriaAndreas Staebler, MD;  Location: [facility] ORS;                Service: Obstetrics;  Laterality: N/A;  Female               born @ [id number] Wt: 7lb 7oz Apgars: 8/9 No date: TONSILLECTOMY     Reproductive/Obstetrics negative OB ROS                              Anesthesia Physical Anesthesia Plan  ASA: III  Anesthesia Plan: General ETT   Post-op Pain Management:    Induction:   Airway Management Planned:   Additional Equipment:   Intra-op Plan:   Post-operative Plan:   Informed Consent: I have reviewed the patients History and Physical, chart, labs and discussed the procedure including the risks, benefits and alternatives for the proposed anesthesia with the patient or authorized representative who has indicated his/her understanding and acceptance.   Dental Advisory Given  Plan Discussed with: Anesthesiologist, CRNA and Surgeon  Anesthesia Plan Comments: (Her PONV seems to be related to narcotics, plan to try to minimize narcs)        Anesthesia Quick Evaluation

## 2016-11-16 NOTE — Brief Op Note (Signed)
11/16/2016  12:39 PM  PATIENT:  Brittany [name]  31 y.o. female  PRE-OPERATIVE DIAGNOSIS:  CALCULUS OF GALLBLADDER  POST-OPERATIVE DIAGNOSIS:  CALCULUS OF GALLBLADDER  PROCEDURE:  Procedure(s): LAPAROSCOPIC CHOLECYSTECTOMY (N/A)  SURGEON:  Surgeon(s) and Role:    * Ricarda Frameharles Seanna Sisler, MD - Primary  PHYSICIAN ASSISTANT:   ASSISTANTS: PA student   ANESTHESIA:   general  EBL:  Total I/O In: 1000 [I.V.:1000] Out: -   BLOOD ADMINISTERED:none  DRAINS: none   LOCAL MEDICATIONS USED:  MARCAINE   , XYLOCAINE  and Amount: 26 ml  SPECIMEN:  Source of Specimen:  gallbladder  DISPOSITION OF SPECIMEN:  PATHOLOGY  COUNTS:  YES  TOURNIQUET:  * No tourniquets in log *  DICTATION: .Dragon Dictation  PLAN OF CARE: Discharge to home after PACU  PATIENT DISPOSITION:  PACU - hemodynamically stable.   Delay start of Pharmacological VTE agent (>24hrs) due to surgical blood loss or risk of bleeding: not applicable

## 2016-11-16 NOTE — Anesthesia Postprocedure Evaluation (Signed)
Anesthesia Post Note  Patient: Brittany BottomKelli M Bartok  Procedure(s) Performed: Procedure(s) (LRB): LAPAROSCOPIC CHOLECYSTECTOMY (N/A)  Patient location during evaluation: PACU Anesthesia Type: General Level of consciousness: awake and alert Pain management: pain level controlled Vital Signs Assessment: post-procedure vital signs reviewed and stable Respiratory status: spontaneous breathing, nonlabored ventilation, respiratory function stable and patient connected to nasal cannula oxygen Cardiovascular status: blood pressure returned to baseline and stable Postop Assessment: no signs of nausea or vomiting Anesthetic complications: no     Last Vitals:  Vitals:   11/16/16 1352 11/16/16 1435  BP: (!) 99/52 110/67  Pulse: (!) 55 70  Resp: 14 14  Temp:      Last Pain:  Vitals:   11/16/16 1338  TempSrc: Temporal  PainSc: 3                  Cleda MccreedyJoseph K Ryla Cauthon

## 2016-11-16 NOTE — H&P (View-Only) (Signed)
Patient ID: Brittany [name], female   DOB: [birth year], 31 y.o.   MRN: [id number]  CC: Abdominal Pain  HPI Brittany [name] is a 31 y.o. female who presents to clinic for evaluation of abdominal pain. Patient states that she's been seen in the ER for her abdominal pain as recently as this past weekend. The pain started after eating party food at a baby shower. She's had at least 5 or 6 episodes in the past all after eating what she describes as unhealthy foods. Pain usually lasts for about 30 minutes were recurs but over the weekend it lasted for almost 2 hours prompting her to go to the ER. It is always in her midepigastrium and feels like it shoots through to her back. She had some subjective fevers and chills over the weekend but none documented in the chart. She had some self-induced nausea and vomiting thinking that it would help with her pain but it did not affect. She denies any chest pain, shortness of breath, diarrhea, constipation. She is otherwise in her usual state of good health and has a breast-feeding child.  HPI  Past Medical History:  Diagnosis Date  . Anxiety   . Depression     Past Surgical History:  Procedure Laterality Date  . CESAREAN SECTION N/A 02/2009  . CESAREAN SECTION N/A 06/11/2016   Procedure: REPEAT CESAREAN SECTION;  Surgeon: Malachy Mood, MD;  Location: [facility] ORS;  Service: Obstetrics;  Laterality: N/A;  Female born @ [birth year] Wt: 7lb 7oz Apgars: 8/9  . TONSILLECTOMY      Family History  Problem Relation Age of Onset  . Thrombosis Mother   . Healthy Father   . Cancer Maternal Grandmother     breast  . Cancer Paternal Grandfather     colon    Social History Social History  Substance Use Topics  . Smoking status: Never Smoker  . Smokeless tobacco: Never Used  . Alcohol use No    Allergies  Allergen Reactions  . Clindamycin Shortness Of Breath  . Codeine Nausea Only    No current outpatient prescriptions on file.   No current facility-administered  medications for this visit.      Review of Systems A Multi-point review of systems was asked and was negative except for the findings documented in the history of present illness  Physical Exam vitals: Blood pressure 128/77, heart rate 97, temperature 98.3 unknown if currently breastfeeding. CONSTITUTIONAL: No acute distress. EYES: Pupils are equal, round, and reactive to light, Sclera are non-icteric. EARS, NOSE, MOUTH AND THROAT: The oropharynx is clear. The oral mucosa is pink and moist. Hearing is intact to voice. LYMPH NODES:  Lymph nodes in the neck are normal. RESPIRATORY:  Lungs are clear. There is normal respiratory effort, with equal breath sounds bilaterally, and without pathologic use of accessory muscles. CARDIOVASCULAR: Heart is regular without murmurs, gallops, or rubs. GI: The abdomen is soft, nontender, and nondistended. There are no palpable masses. There is no hepatosplenomegaly. There are normal bowel sounds in all quadrants. GU: Rectal deferred.   MUSCULOSKELETAL: Normal muscle strength and tone. No cyanosis or edema.   SKIN: Turgor is good and there are no pathologic skin lesions or ulcers. NEUROLOGIC: Motor and sensation is grossly normal. Cranial nerves are grossly intact. PSYCH:  Oriented to person, place and time. Affect is normal.  Data Reviewed Images and labs reviewed. Labs are primarily in normal limits the white blood cell count 9.9 and a bilirubin of 0.9. There is a  mild elevated transaminase of 160 for AST and 119 for ALT. Alk phosphatase is also mildly elevated at 134. Ultrasound the abdomen shows multiple gallstones within a contracted gallbladder. No evidence of a lateral thickening, pericolic cystic fluid, ductal dilatation. I have personally reviewed the patient's imaging, laboratory findings and medical records.    Assessment    Symptomatic cholelithiasis    Plan    31 year old female with a history and physical consistent with symptomatic  cholelithiasis. Discussed the diagnosis in detail including the treatment options that would include a laparoscopic cholecystectomy. I discussed the procedure in detail.  The patient was given Neurosurgeon.  We discussed the risks and benefits of a laparoscopic cholecystectomy and possible cholangiogram including, but not limited to bleeding, infection, injury to surrounding structures such as the intestine or liver, bile leak, retained gallstones, need to convert to an open procedure, prolonged diarrhea, blood clots such as  DVT, common bile duct injury, anesthesia risks, and possible need for additional procedures.  The likelihood of improvement in symptoms and return to the patient's normal status is good. We discussed the typical post-operative recovery course. Also discussed the need to refrain from using her breast milk while recovering from surgery until the anesthetic and narcotics her out of her system. Patient voiced understanding and desires to proceed. We will plan for surgery on Friday, January 26.      Time spent with the patient was 45 minutes, with more than 50% of the time spent in face-to-face education, counseling and care coordination.     Clayburn Pert, MD FACS General Surgeon 10/30/2016, 2:26 PM

## 2016-11-16 NOTE — Progress Notes (Signed)
Patient decided to pump prior to surgery.

## 2016-11-16 NOTE — Op Note (Signed)
Laparoscopic Cholecystectomy  Pre-operative Diagnosis: Biliary colic  Post-operative Diagnosis: Same  Procedure: Laparoscopic cholecystectomy with intraoperative cholangiogram  Surgeon: Leonette Most T. Tonita Cong, MD FACS  Anesthesia: Gen. with endotracheal tube  Assistant: PA student  Procedure Details  The patient was seen again in the Holding Room. The benefits, complications, treatment options, and expected outcomes were discussed with the patient. The risks of bleeding, infection, recurrence of symptoms, failure to resolve symptoms, bile duct damage, bile duct leak, retained common bile duct stone, bowel injury, any of which could require further surgery and/or ERCP, stent, or papillotomy were reviewed with the patient. The likelihood of improving the patient's symptoms with return to their baseline status is good.  The patient and/or family concurred with the proposed plan, giving informed consent.  The patient was taken to Operating Room, identified as Brittany [name] and the procedure verified as Laparoscopic Cholecystectomy.  A Time Out was held and the above information confirmed.  Prior to the induction of general anesthesia, antibiotic prophylaxis was administered. VTE prophylaxis was in place. General endotracheal anesthesia was then administered and tolerated well. After the induction, the abdomen was prepped with Chloraprep and draped in the sterile fashion. The patient was positioned in the supine position.  Local anesthetic  was injected into the skin near the umbilicus and an incision made. The Veress needle was placed. Pneumoperitoneum was then created with CO2 and tolerated well without any adverse changes in the patient's vital signs. A 5mm port was placed in the periumbilical position and the abdominal cavity was explored.  Two 5-mm ports were placed in the right upper quadrant and a 12 mm epigastric port was placed all under direct vision. All skin incisions  were infiltrated with a  local anesthetic agent before making the incision and placing the trocars.   The patient was positioned  in reverse Trendelenburg, tilted slightly to the patient's left.  The gallbladder was identified, the fundus grasped and retracted cephalad. Adhesions were lysed bluntly. The infundibulum was grasped and retracted laterally, exposing the peritoneum overlying the triangle of Calot. This was then divided and exposed in a blunt fashion. A critical view of the cystic duct and cystic artery was obtained.  The cystic duct was clearly identified and bluntly dissected.   A single clip was placed on what was thought to be the cystic artery. At this point a Kumar clamp catheters brought to the field and placed across the mid body of the gallbladder with the catheter being placed into the gallbladder with immediate return of bile. C-arm was brought to the field and a cholangiogram was performed. This showed immediate filling of the cystic duct going into the common duct and the small bowel. It then reflux into the right and left hepatics confirming a complete cholangiogram. There is no evidence of filling defect appreciated at the time of the glandular gram. The C-arm was withdrawn and the clamp and catheter were removed. After this the cystic duct and artery that were previously visualized were serially clipped with endoclips and cut in between with Endo Shears.  The gallbladder was taken from the gallbladder fossa in a retrograde fashion with the electrocautery. The gallbladder was removed and placed in an Endocatch bag. The liver bed was irrigated and inspected. Hemostasis was achieved with the electrocautery. Copious irrigation was utilized and was repeatedly aspirated until clear.  The gallbladder and Endocatch sac were then removed through the epigastric port site.    Inspection of the right upper quadrant was performed. No  bleeding, bile duct injury or leak, or bowel injury was noted. Pneumoperitoneum was  released.  4-0 subcuticular Monocryl was used to close the skin. Steristrips and Mastisol and sterile dressings were  applied.  The patient was then extubated and brought to the recovery room in stable condition. Sponge, lap, and needle counts were correct at closure and at the conclusion of the case.   Findings: Consistent with previous biliary colic   Estimated Blood Loss: 10 mL         Drains: None         Specimens: Gallbladder           Complications: none               Brittany Ugarte T. Tonita CongWoodham, MD, FACS

## 2016-11-16 NOTE — Interval H&P Note (Signed)
History and Physical Interval Note:  11/16/2016 10:30 AM  Brittany [name]  has presented today for surgery, with the diagnosis of CALCULUS OF GALLBLADDER  The various methods of treatment have been discussed with the patient and family. After consideration of risks, benefits and other options for treatment, the patient has consented to  Procedure(s): LAPAROSCOPIC CHOLECYSTECTOMY (N/A) as a surgical intervention .  The patient's history has been reviewed, patient examined, no change in status, stable for surgery.  I have reviewed the patient's chart and labs.  Questions were answered to the patient's satisfaction.     Ricarda Frameharles Geanine Vandekamp

## 2016-11-16 NOTE — Anesthesia Post-op Follow-up Note (Cosign Needed)
Anesthesia QCDR form completed.        

## 2016-11-16 NOTE — Transfer of Care (Signed)
Immediate Anesthesia Transfer of Care Note  Patient: Brittany [name]  Procedure(s) Performed: Procedure(s): LAPAROSCOPIC CHOLECYSTECTOMY (N/A)  Patient Location: PACU  Anesthesia Type:General  Level of Consciousness: sedated  Airway & Oxygen Therapy: Patient Spontanous Breathing and Patient connected to face mask oxygen  Post-op Assessment: Report given to RN and Post -op Vital signs reviewed and stable  Post vital signs: Reviewed and stable  Last Vitals:  Vitals:   11/16/16 0953  BP: (!) 115/57  Pulse: 70  Resp: 18  Temp: (!) 35.6 C    Last Pain:  Vitals:   11/16/16 0953  TempSrc: Tympanic         Complications: No apparent anesthesia complications

## 2016-11-19 ENCOUNTER — Encounter: Payer: Self-pay | Admitting: General Surgery

## 2016-11-19 LAB — SURGICAL PATHOLOGY

## 2016-11-22 ENCOUNTER — Ambulatory Visit (INDEPENDENT_AMBULATORY_CARE_PROVIDER_SITE_OTHER): Payer: BLUE CROSS/BLUE SHIELD | Admitting: General Surgery

## 2016-11-22 ENCOUNTER — Encounter: Payer: Self-pay | Admitting: General Surgery

## 2016-11-22 VITALS — BP 114/77 | HR 82 | Temp 98.1°F | Ht 64.0 in | Wt 175.0 lb

## 2016-11-22 DIAGNOSIS — Z4889 Encounter for other specified surgical aftercare: Secondary | ICD-10-CM

## 2016-11-22 NOTE — Progress Notes (Signed)
Outpatient Surgical Follow Up  11/22/2016  Brittany [name] is an 31 y.o. female.   Chief Complaint  Patient presents with  . Routine Post Op    Laparoscopic Cholecystectomy    HPI: 31 year old female returns to clinic for follow-up 1 week status post laparoscopic cholecystectomy. Patient reports doing very well. She's not required any pain medications. She is eating well and having normal bowel function. She denies any fevers, chills, nausea, vomiting, chest pain, shortness of breath, diarrhea, constipation.  Past Medical History:  Diagnosis Date  . Anxiety   . Complication of anesthesia   . Depression   . GERD (gastroesophageal reflux disease)   . Headache    H/O MIGRAINES  . Heart murmur    AS A CHILD-PT STATES SHE HAS OUTGROWN MURMUR AND NEVER HAD A PROBLEM WITH IT AS A CHILD  . History of methicillin resistant staphylococcus aureus (MRSA) 2008   LEG  . PONV (postoperative nausea and vomiting)     Past Surgical History:  Procedure Laterality Date  . CESAREAN SECTION N/A 02/2009  . CESAREAN SECTION N/A 06/11/2016   Procedure: REPEAT CESAREAN SECTION;  Surgeon: Vena AustriaAndreas Staebler, MD;  Location: [facility] ORS;  Service: Obstetrics;  Laterality: N/A;  Female born @ [id number] Wt: 7lb 7oz Apgars: 8/9  . CHOLECYSTECTOMY N/A 11/16/2016   Procedure: LAPAROSCOPIC CHOLECYSTECTOMY;  Surgeon: Ricarda Frameharles Nimrit Kehres, MD;  Location: [facility] ORS;  Service: General;  Laterality: N/A;  . TONSILLECTOMY      Family History  Problem Relation Age of Onset  . Thrombosis Mother   . Healthy Father   . Cancer Maternal Grandmother     breast  . Cancer Paternal Grandfather     colon    Social History:  reports that she quit smoking about 2 years ago. Her smoking use included Cigarettes. She has a 2.50 pack-year smoking history. She has never used smokeless tobacco. She reports that she drinks alcohol. She reports that she does not use drugs.  Allergies:  Allergies  Allergen Reactions  . Clindamycin Shortness Of  Breath  . Codeine Nausea Only    Medications reviewed.    ROS A multipoint review of systems was completed. All pertinent positives and negatives are documented within the history of present illness remainder are negative.   BP 114/77   Pulse 82   Temp 98.1 F (36.7 C) (Oral)   Ht 5\' 4"  (1.626 m)   Wt 79.4 kg (175 lb)   BMI 30.04 kg/m   Physical Exam Gen.: No acute distress Chest: Clear to auscultation Heart: Regular rhythm Abdomen: Soft, nontender, nondistended. Well approximated laparoscopic incision sites any evidence of erythema or drainage. There is some hyperemia on the most lateral right upper quadrant incision site but no evidence of drainage or purulence.    No results found for this or any previous visit (from the past 48 hour(s)). No results found.  Assessment/Plan:  1. Aftercare following surgery 31 year old female status post laparoscopic cholecystectomy. Pathology reviewed with the patient. Doing very well. Discussed continued anticipated recovery and return to normal activities. All questions answered to her satisfaction and she'll follow-up in clinic on an as-needed basis.     Ricarda Frameharles Milika Ventress, MD FACS General Surgeon  11/22/2016,2:38 PM

## 2016-11-22 NOTE — Patient Instructions (Signed)
Please call our office if you have questions or concerns.   

## 2017-07-28 IMAGING — US US ABDOMEN LIMITED
1 series · 14 of 25 positions shown · non-contrast
Comparison: None.

CLINICAL DATA: Intermittent abdominal pain

EXAM:
US ABDOMEN LIMITED - RIGHT UPPER QUADRANT

[Series 1: us abdomen limited · 0.20mm/px · 14 of 40 slices shown]
[im 1/40]
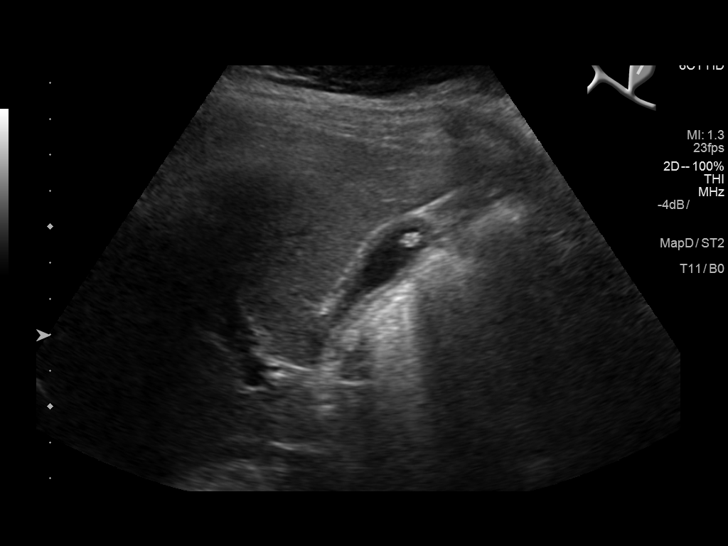
[im 4/40]
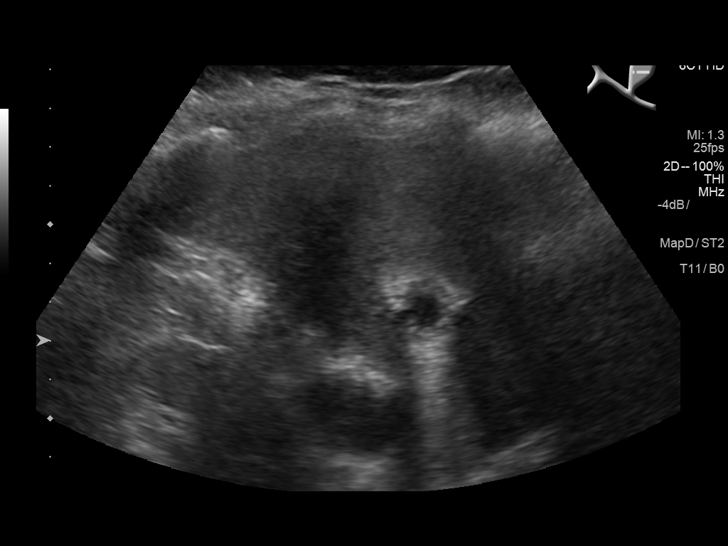
[im 7/40]
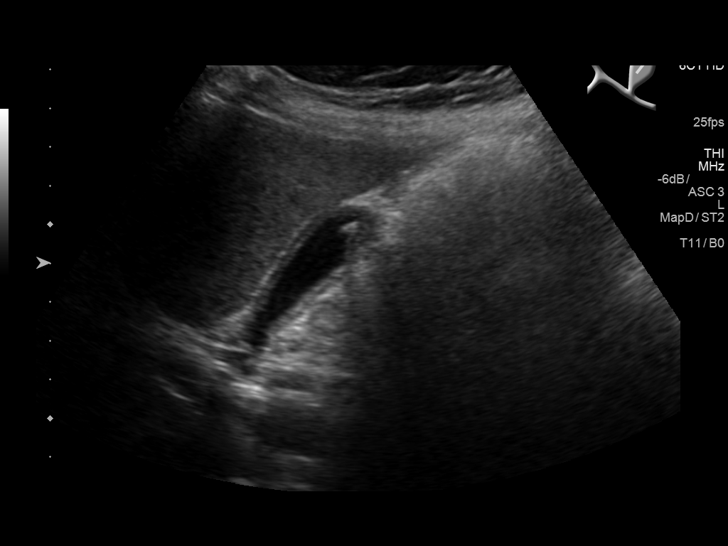
[im 10/40]
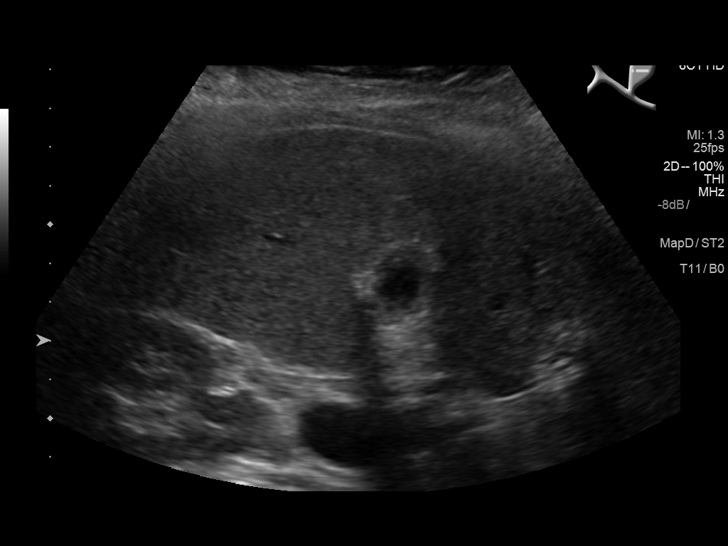
[im 14/40]
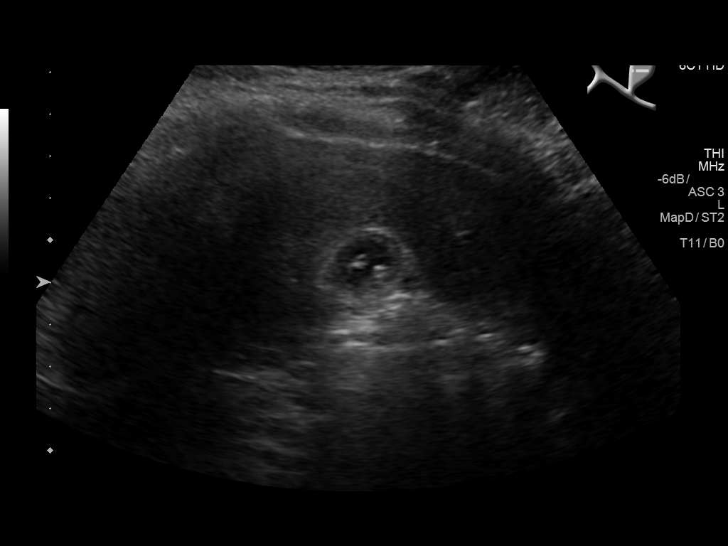
[im 15/40]
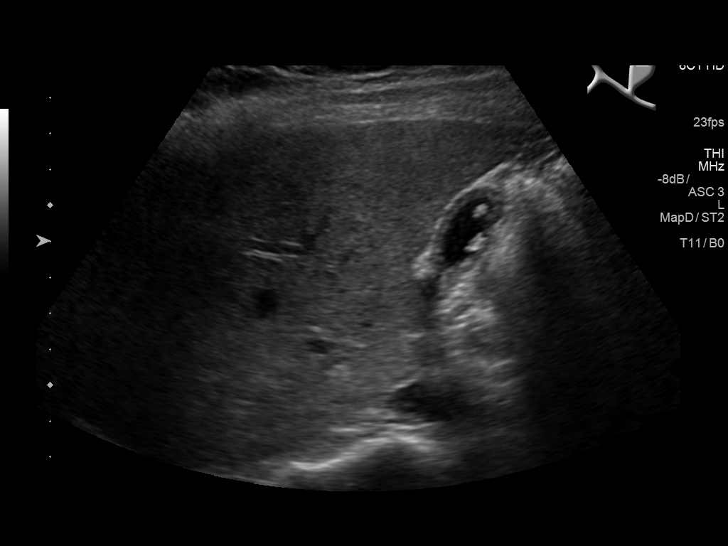
[im 18/40]
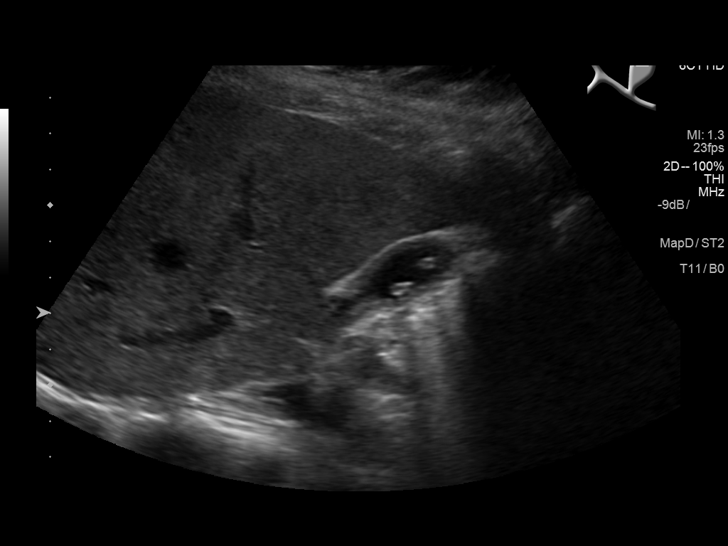
[im 22/40]
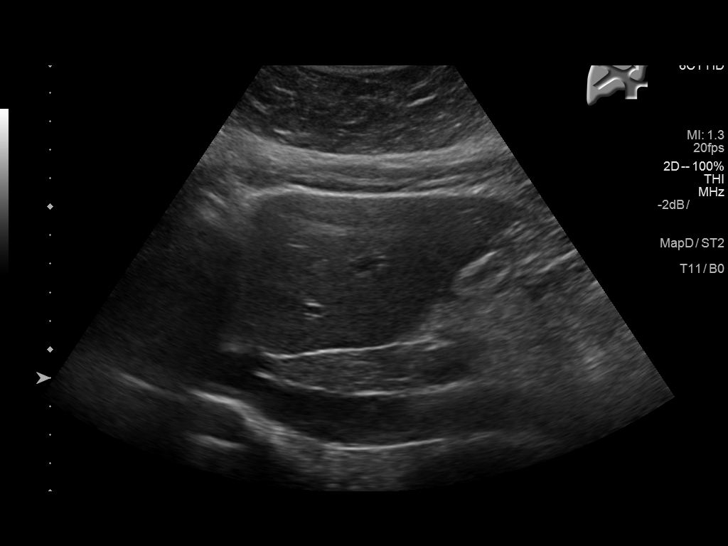
[im 25/40]
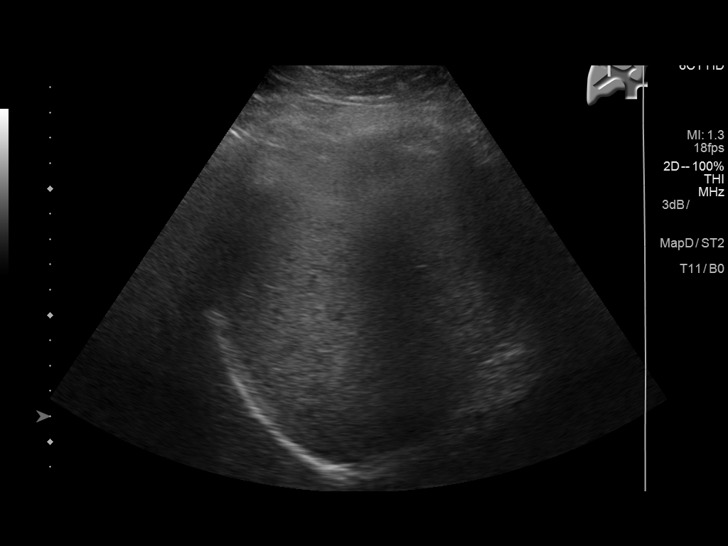
[im 27/40]
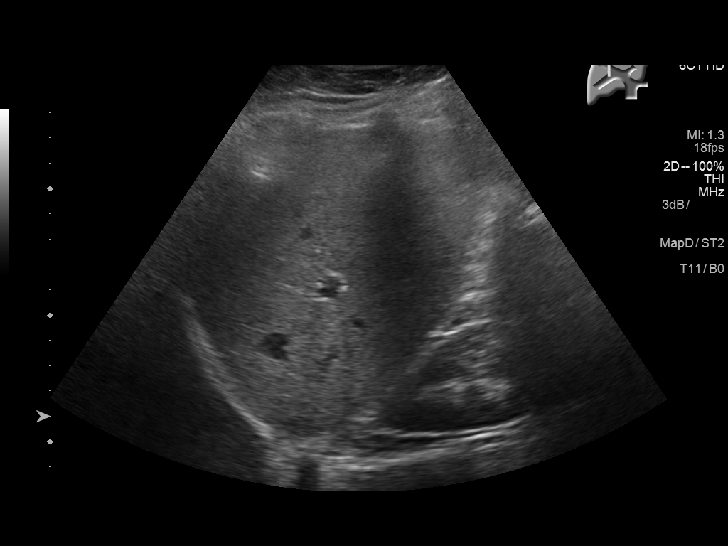
[im 30/40]
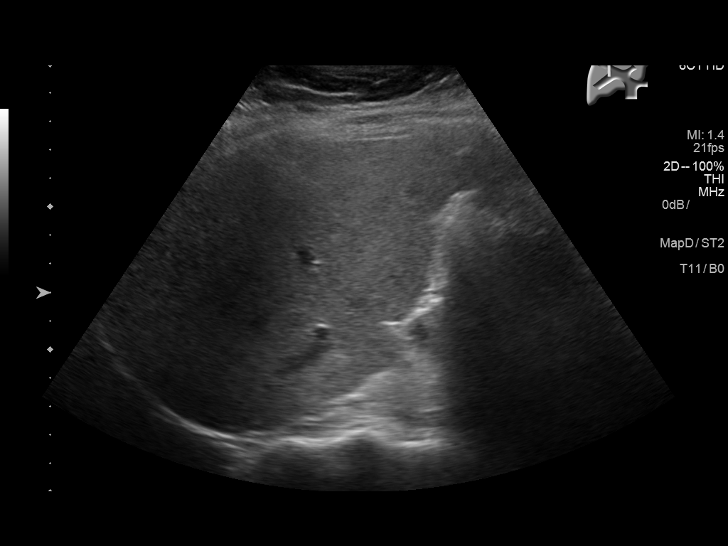
[im 33/40]
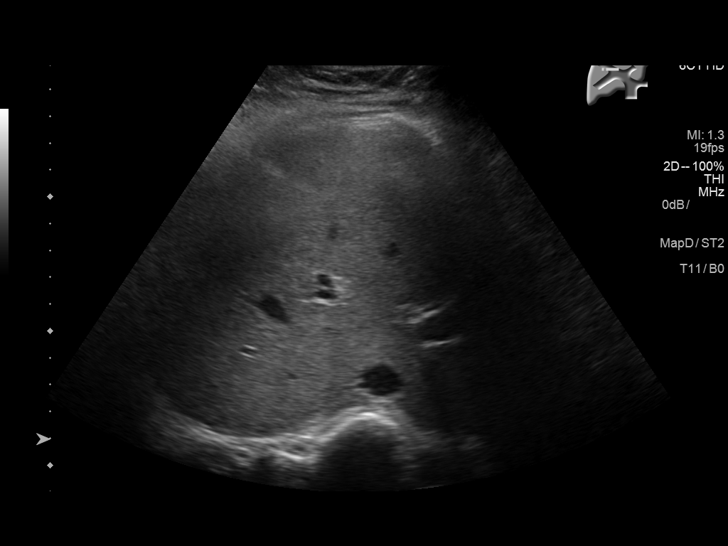
[im 36/40]
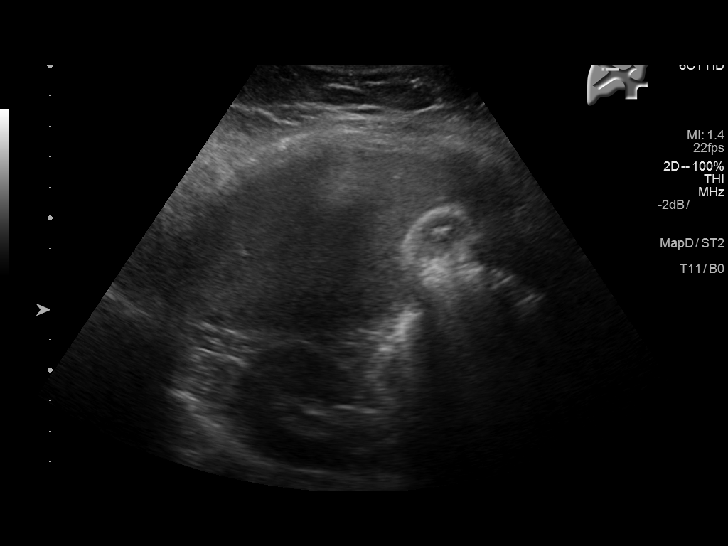
[im 40/40]
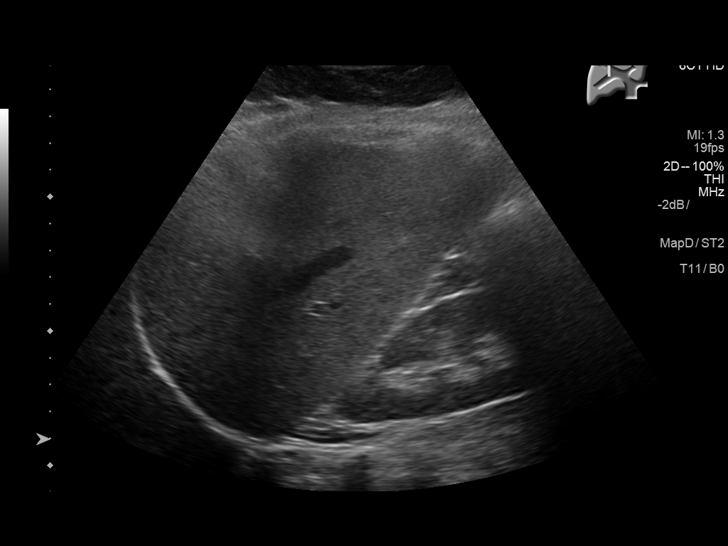

[14 of 25 positions shown; findings below may reference images not displayed]

FINDINGS: Gallbladder:

Mildly contracted. Multiple calcified stones present. Negative
sonographic [place]. Wall thickness 4 mm, likely augmented by
contracted state

Common bile duct:

Diameter: 4.5 mm

Liver:

No focal lesion identified. Within normal limits in parenchymal
echogenicity.
IMPRESSION: Contracted gallbladder with stones present. Mild wall thickening is
likely related to contracted state. No definite sonographic evidence
for acute cholecystitis. No biliary dilatation.

## 2019-07-26 ENCOUNTER — Other Ambulatory Visit: Payer: Self-pay

## 2019-07-26 ENCOUNTER — Emergency Department
Admission: EM | Admit: 2019-07-26 | Discharge: 2019-07-26 | Disposition: A | Discharge status: Home / Self Care | Payer: 59 | Attending: Emergency Medicine | Admitting: Emergency Medicine

## 2019-07-26 ENCOUNTER — Encounter: Payer: Self-pay | Admitting: Emergency Medicine

## 2019-07-26 ENCOUNTER — Emergency Department: Payer: 59

## 2019-07-26 DIAGNOSIS — Y69 Unspecified misadventure during surgical and medical care: Secondary | ICD-10-CM | POA: Insufficient documentation

## 2019-07-26 DIAGNOSIS — Z87891 Personal history of nicotine dependence: Secondary | ICD-10-CM | POA: Diagnosis not present

## 2019-07-26 DIAGNOSIS — R1032 Left lower quadrant pain: Secondary | ICD-10-CM | POA: Diagnosis present

## 2019-07-26 DIAGNOSIS — T8332XA Displacement of intrauterine contraceptive device, initial encounter: Secondary | ICD-10-CM | POA: Insufficient documentation

## 2019-07-26 DIAGNOSIS — R102 Pelvic and perineal pain: Secondary | ICD-10-CM

## 2019-07-26 LAB — CBC
HCT: 50.3 % — ABNORMAL HIGH (ref 36.0–46.0)
Hemoglobin: 16.7 g/dL — ABNORMAL HIGH (ref 12.0–15.0)
MCH: 30 pg (ref 26.0–34.0)
MCHC: 33.2 g/dL (ref 30.0–36.0)
MCV: 90.5 fL (ref 80.0–100.0)
Platelets: 376 10*3/uL (ref 150–400)
RBC: 5.56 MIL/uL — ABNORMAL HIGH (ref 3.87–5.11)
RDW: 12.3 % (ref 11.5–15.5)
WBC: 8.6 10*3/uL (ref 4.0–10.5)
nRBC: 0 % (ref 0.0–0.2)

## 2019-07-26 LAB — URINALYSIS, COMPLETE (UACMP) WITH MICROSCOPIC
Bacteria, UA: NONE SEEN
Bilirubin Urine: NEGATIVE
Glucose, UA: NEGATIVE mg/dL
Hgb urine dipstick: NEGATIVE
Ketones, ur: NEGATIVE mg/dL
Nitrite: NEGATIVE
Protein, ur: NEGATIVE mg/dL
Specific Gravity, Urine: 1.016 (ref 1.005–1.030)
pH: 6 (ref 5.0–8.0)

## 2019-07-26 LAB — COMPREHENSIVE METABOLIC PANEL
ALT: 37 U/L (ref 0–44)
AST: 22 U/L (ref 15–41)
Albumin: 4.6 g/dL (ref 3.5–5.0)
Alkaline Phosphatase: 77 U/L (ref 38–126)
Anion gap: 9 (ref 5–15)
BUN: 12 mg/dL (ref 6–20)
CO2: 27 mmol/L (ref 22–32)
Calcium: 9.1 mg/dL (ref 8.9–10.3)
Chloride: 101 mmol/L (ref 98–111)
Creatinine, Ser: 0.8 mg/dL (ref 0.44–1.00)
GFR calc Af Amer: >60 mL/min (ref >60)
GFR calc non Af Amer: >60 mL/min (ref >60)
Glucose, Bld: 102 mg/dL — ABNORMAL HIGH (ref 70–99)
Potassium: 3.9 mmol/L (ref 3.5–5.1)
Sodium: 137 mmol/L (ref 135–145)
Total Bilirubin: 0.7 mg/dL (ref 0.3–1.2)
Total Protein: 8 g/dL (ref 6.5–8.1)

## 2019-07-26 LAB — WET PREP, GENITAL
Clue Cells Wet Prep HPF POC: NONE SEEN
Sperm: NONE SEEN
Trich, Wet Prep: NONE SEEN

## 2019-07-26 LAB — POCT PREGNANCY, URINE: Preg Test, Ur: NEGATIVE

## 2019-07-26 MED ORDER — FLUCONAZOLE 50 MG PO TABS
150.0000 mg | ORAL_TABLET | Freq: Once | ORAL | Status: AC
  Administered 2019-07-26: 150 mg via ORAL
  Filled 2019-07-26: qty 1

## 2019-07-26 NOTE — ED Triage Notes (Signed)
Pt c/o LLQ pain for the past 3 days. Denies N/V/D.Marland Kitchen states she has an IUD and does not have regular periods.

## 2019-07-26 NOTE — ED Provider Notes (Signed)
[facility] Emergency Department Provider Note  Time seen: 7:53 AM  I have reviewed the triage vital signs and the nursing notes.   HISTORY  Chief Complaint Abdominal Pain   HPI Brittany [name] is a 33 y.o. female with a past medical history of anxiety, gastric reflux, depression, presents to the emergency department for lower abdominal pain.  According to the patient since yesterday she has been experiencing somewhat sharp left lower quadrant abdominal pain.  Patient denies any nausea vomiting or diarrhea.  Denies any dysuria or hematuria.  Denies vaginal bleeding or discharge.  Patient has an IUD in place.  Does not have menstrual periods.  Denies any fever cough or shortness of breath.   Past Medical History:  Diagnosis Date  . Anxiety   . Complication of anesthesia   . Depression   . GERD (gastroesophageal reflux disease)   . Headache    H/O MIGRAINES  . Heart murmur    AS A CHILD-PT STATES SHE HAS OUTGROWN MURMUR AND NEVER HAD A PROBLEM WITH IT AS A CHILD  . History of methicillin resistant staphylococcus aureus (MRSA) 2008   LEG  . PONV (postoperative nausea and vomiting)     Patient Active Problem List   Diagnosis Date Noted  . Surgery, elective   . Amniotic fluid leaking 06/11/2016  . Back pain affecting pregnancy, antepartum 04/16/2016    Past Surgical History:  Procedure Laterality Date  . CESAREAN SECTION N/A 02/2009  . CESAREAN SECTION N/A 06/11/2016   Procedure: REPEAT CESAREAN SECTION;  Surgeon: Malachy Mood, MD;  Location: [facility] ORS;  Service: Obstetrics;  Laterality: N/A;  Female born @ [birth year] Wt: 7lb 7oz Apgars: 8/9  . CHOLECYSTECTOMY N/A 11/16/2016   Procedure: LAPAROSCOPIC CHOLECYSTECTOMY;  Surgeon: Clayburn Pert, MD;  Location: [facility] ORS;  Service: General;  Laterality: N/A;  . TONSILLECTOMY      Prior to Admission medications   Medication Sig Start Date End Date Taking? Authorizing Provider  calcium carbonate (TUMS - DOSED IN  MG ELEMENTAL CALCIUM) 500 MG chewable tablet Chew 1 tablet by mouth as needed for indigestion or heartburn.    [provider]    Allergies  Allergen Reactions  . Clindamycin Shortness Of Breath  . Codeine Nausea Only    Family History  Problem Relation Age of Onset  . Thrombosis Mother   . Healthy Father   . Cancer Maternal Grandmother        breast  . Cancer Paternal Grandfather        colon    Social History Social History   Tobacco Use  . Smoking status: Former Smoker    Packs/day: 0.25    Years: 10.00    Pack years: 2.50    Types: Cigarettes    Quit date: 11/09/2014    Years since quitting: 4.7  . Smokeless tobacco: Never Used  Substance Use Topics  . Alcohol use: Yes    Comment: WINE RARELY  . Drug use: No    Review of Systems Constitutional: Negative for fever. Cardiovascular: Negative for chest pain. Respiratory: Negative for shortness of breath. Gastrointestinal: Left lower quadrant/left pelvic pain.  Patient does state frequent gastric reflux. Genitourinary: Negative for urinary compaints.  Negative for vaginal bleeding or discharge.  Strings are still intact per patient.  Musculoskeletal: Negative for musculoskeletal complaints Skin: Negative for skin complaints  Neurological: Negative for headache All other ROS negative  ____________________________________________   PHYSICAL EXAM:  VITAL SIGNS: ED Triage Vitals  Enc Vitals  Group     BP --      Pulse Rate 07/26/19 0739 96     Resp 07/26/19 0739 16     Temp 07/26/19 0739 98.3 F (36.8 C)     Temp Source 07/26/19 0739 Oral     SpO2 07/26/19 0739 97 %     Weight 07/26/19 0740 175 lb (79.4 kg)     Height 07/26/19 0740 5\' 3"  (1.6 m)     Head Circumference --      Peak Flow --      Pain Score 07/26/19 0740 8     Pain Loc --      Pain Edu? --      Excl. in GC? --    Constitutional: Alert and oriented. Well appearing and in no distress. Eyes: Normal exam ENT      Head:  Normocephalic and atraumatic.      Mouth/Throat: Mucous membranes are moist. Cardiovascular: Normal rate, regular rhythm.  Respiratory: Normal respiratory effort without tachypnea nor retractions. Breath sounds are clear  Gastrointestinal: Soft, mild to moderate very low left lower quadrant tenderness over the adnexal area.  No rebound guarding or distention. Musculoskeletal: Nontender with normal range of motion in all extremities.  Neurologic:  Normal speech and language. No gross focal neurologic deficits  Skin:  Skin is warm, dry and intact.  Psychiatric: Mood and affect are normal.  ____________________________________________    RADIOLOGY  Ultrasound shows malpositioned IUD in the lower uterine segment extending into the cervical canal.  With fluid in the endometrial canal.  ____________________________________________   INITIAL IMPRESSION / ASSESSMENT AND PLAN / ED COURSE  Pertinent labs & imaging results that were available during my care of the patient were reviewed by me and considered in my medical decision making (see chart for details).   Patient with tenderness over the left pelvis/left adnexal area.  No rebound guarding or distention.  Overall the patient appears well, no distress.  Differential would include ovarian cyst/hemorrhagic cysts, perforated IUD, UTI or pyelonephritis, ureterolithiasis, less likely colitis or diverticulitis.  We will check labs, urine sample, obtain a pelvic ultrasound to further evaluate.  Patient agreeable to plan of care.  Patient has a malpositioned IUD, is unclear if this is the cause of the patient's discomfort.  The rest of her work-up is largely nonrevealing including lab work.  [facility].  I discussed the patient with Dr. Gaynelle ArabianShuman who has reviewed the ultrasound.  Recommends pelvic exam and pulling the IUD in the emergency department.  Patient wishes to have the IUD removed and will  follow-up this week with OB for likely replacement.  We will perform swabs do a pelvic exam and attempt IUD removal with ring forceps.  Patient agreeable to plan of care.  I performed the pelvic exam, strings were present I was able to use ring forceps and easily remove the IUD without any difficulty.  Patient does have a mild to moderate amount of vaginal discharge.  Wet prep positive for yeast we will treat with a one-time dose of Diflucan.  Patient will follow-up with OB this week.  I discussed alternate contraceptives with the patient as the patient no longer has an IUD in place.  Britt BottomKelli M Alfredo was evaluated in Emergency Department on 07/26/2019 for the symptoms described in the history of present illness. She was evaluated in the context of the global COVID-19 pandemic, which necessitated consideration that the patient might be at risk  for infection with the SARS-CoV-2 virus that causes COVID-19. Institutional protocols and algorithms that pertain to the evaluation of patients at risk for COVID-19 are in a state of rapid change based on information released by regulatory bodies including the CDC and federal and state organizations. These policies and algorithms were followed during the patient's care in the ED.  ____________________________________________   FINAL CLINICAL IMPRESSION(S) / ED DIAGNOSES  Abdominal pain Malpositioned IUD   Minna Antis, MD 07/26/19 1213

## 2019-07-26 NOTE — ED Notes (Signed)
POC pregnancy test negative.

## 2019-07-27 ENCOUNTER — Telehealth: Payer: Self-pay | Admitting: Obstetrics and Gynecology

## 2019-07-27 NOTE — Telephone Encounter (Signed)
Called and left voice mail for patient to call back to be schedule °

## 2019-07-27 NOTE — Telephone Encounter (Signed)
-----   Message from Homero Fellers, MD sent at 07/26/2019 11:02 AM EDT ----- Regarding: IUD This patient was seen in the ER over the weekend. Her IUD is in the lower segment. The ER was going to attempt to remove because of pain. Could you please call and schedule her for a visit so that it would be replaced if she desires.  Thank you,  Dr. Gilman Schmidt

## 2019-07-28 NOTE — Telephone Encounter (Signed)
Called and spoke with patient to follow up. Patient doesn't wish to have another IUD and doesn't know what she would like to do at this time. Advise patient to call to schedule a birth control consult at her convince

## 2019-09-29 ENCOUNTER — Other Ambulatory Visit: Payer: Self-pay | Admitting: Family Medicine

## 2019-09-29 DIAGNOSIS — R1011 Right upper quadrant pain: Secondary | ICD-10-CM

## 2019-09-29 DIAGNOSIS — R7989 Other specified abnormal findings of blood chemistry: Secondary | ICD-10-CM

## 2019-10-01 ENCOUNTER — Telehealth: Payer: Self-pay | Admitting: *Deleted

## 2020-11-27 IMAGING — US US PELVIS COMPLETE
1 series · 13 of 25 positions shown · non-contrast
Comparison: None.

CLINICAL DATA: Left adnexal pain

EXAM:
TRANSABDOMINAL AND TRANSVAGINAL ULTRASOUND OF PELVIS
DOPPLER ULTRASOUND OF OVARIES
TECHNIQUE: Both transabdominal and transvaginal ultrasound examinations of the
pelvis were performed. Transabdominal technique was performed for
global imaging of the pelvis including uterus, ovaries, adnexal
regions, and pelvic cul-de-sac.
It was necessary to proceed with endovaginal exam following the
transabdominal exam to visualize the endometrium and ovaries. Color
and duplex Doppler ultrasound was utilized to evaluate blood flow to
the ovaries.

[Series 1: us pelvis complete · 13 of 82 slices shown]
[im 1/82]
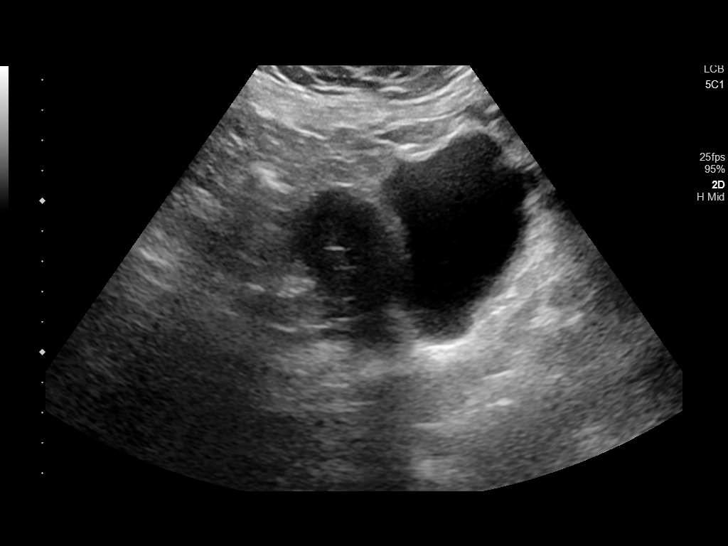
[im 7/82]
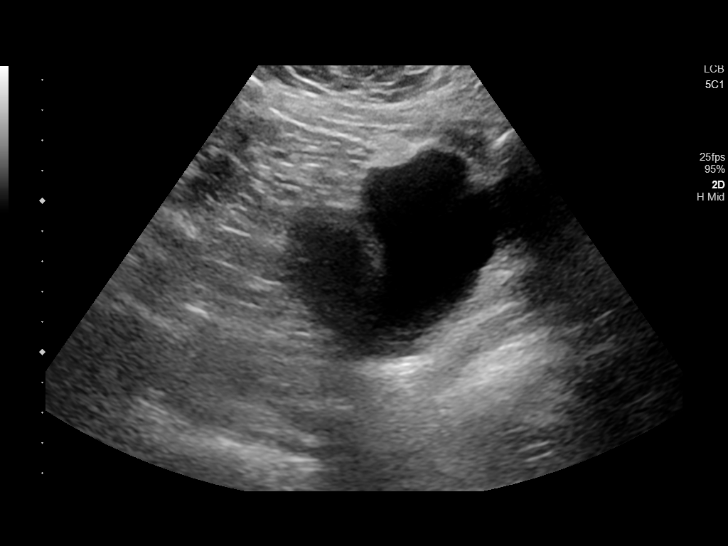
[im 14/82]
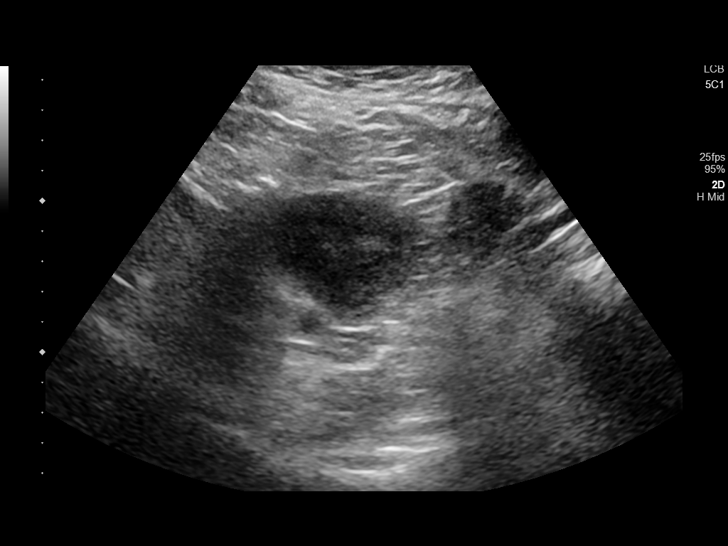
[im 21/82]
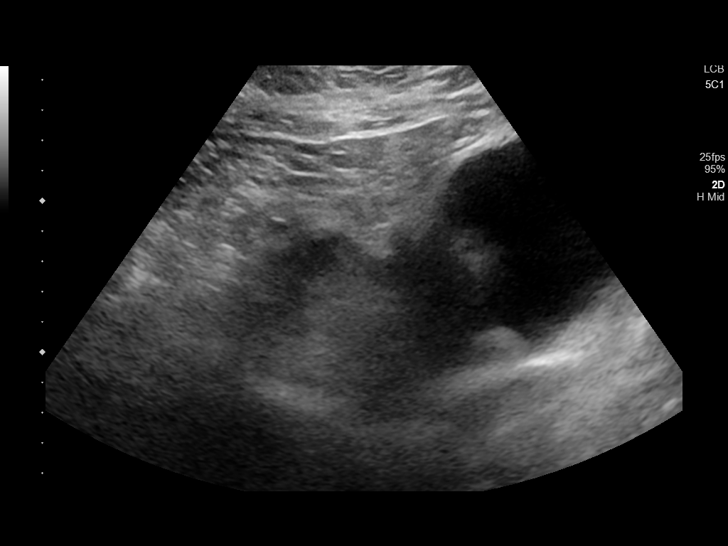
[im 28/82]
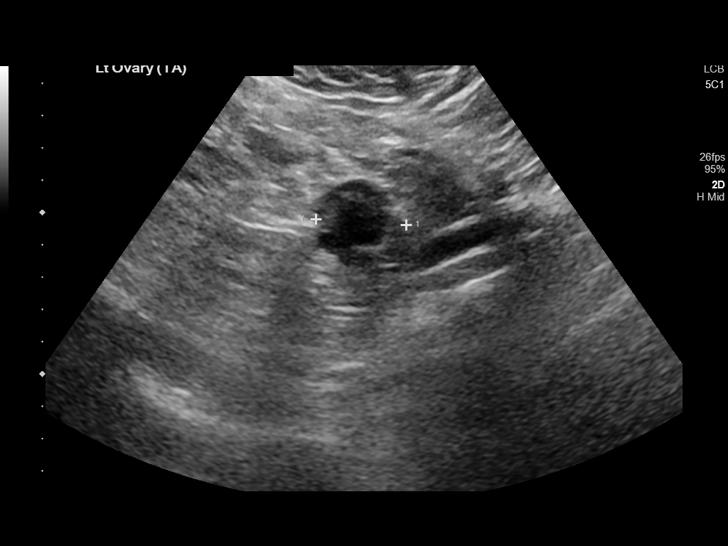
[im 34/82]
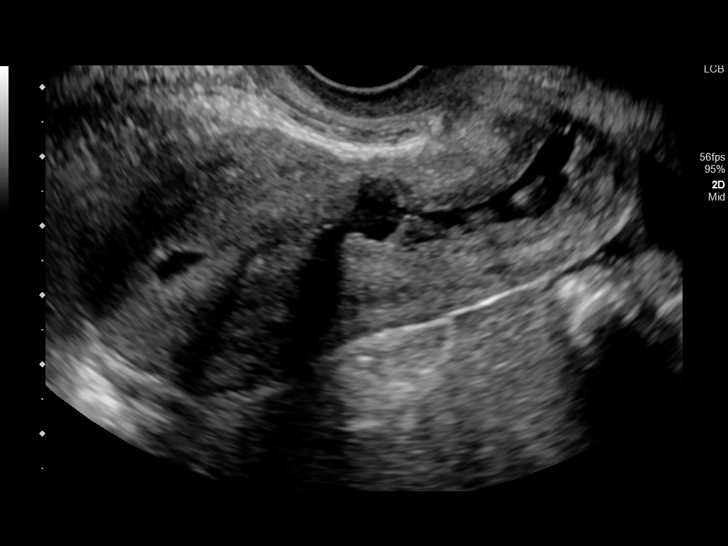
[im 41/82]
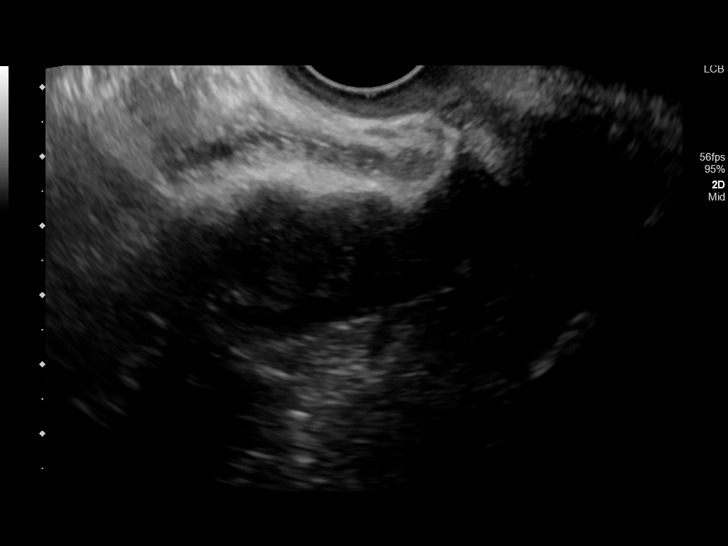
[im 48/82]
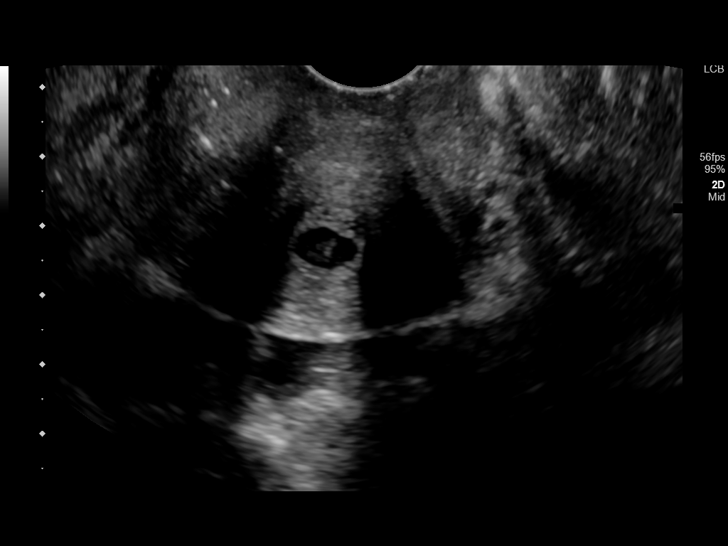
[im 55/82]
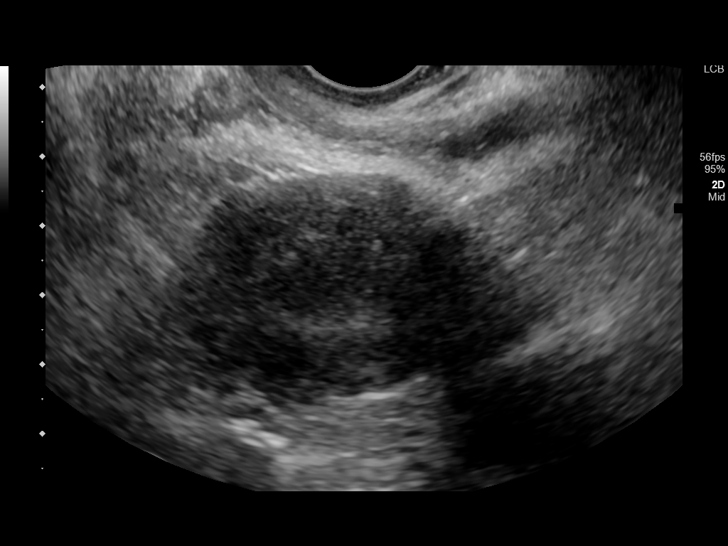
[im 61/82]
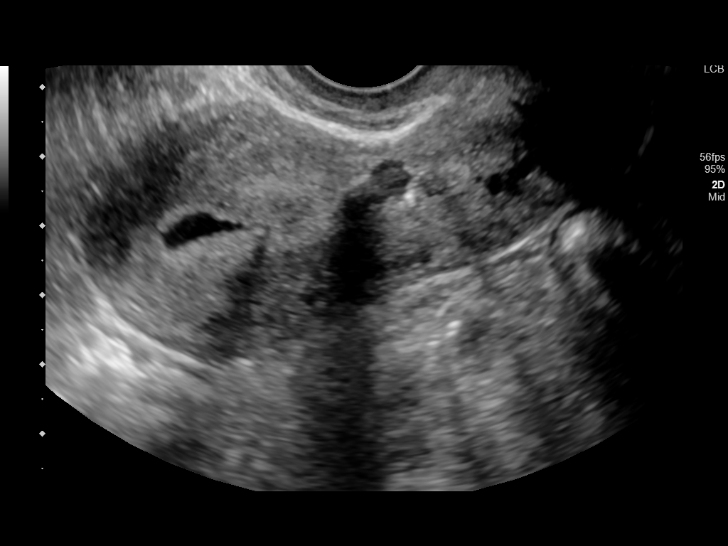
[im 68/82]
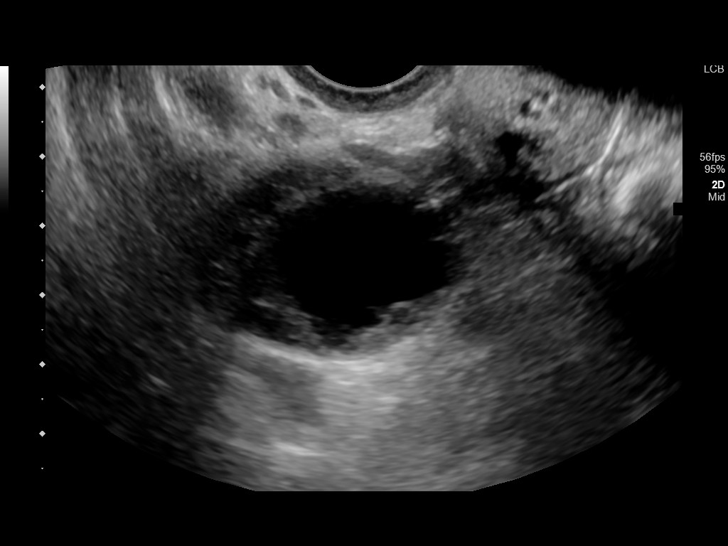
[im 75/82]
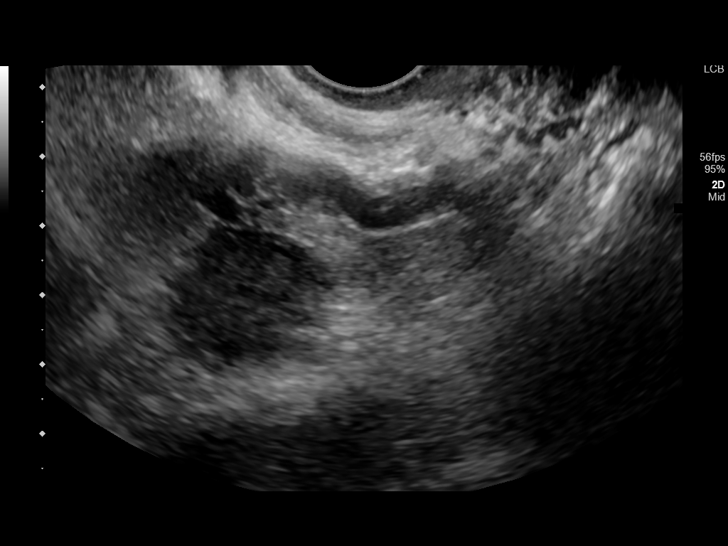
[im 82/82]
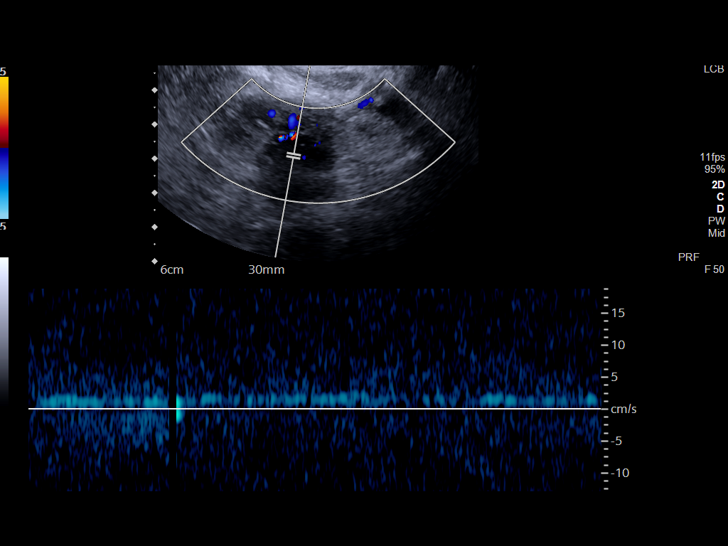

[13 of 25 positions shown; findings below may reference images not displayed]

FINDINGS: Uterus

Measurements: 8.1 x 3.8 x 4.1 cm = volume: 66 mL. No fibroids or
other mass visualized.

Endometrium

Thickness: 5 mm. No focal abnormality. Intrauterine device in the
lower uterine segment extending into the cervix. Fluid in the
endometrial cavity.

Right ovary

Measurements: 4.6 x 2.8 x 3.8 cm = volume: 26 mL. Normal
appearance/no adnexal mass. 2.5 cm dominant right ovarian follicle.

Left ovary

Measurements: 2.4 x 2.2 x 2.4 cm = volume: 6 mL. Normal
appearance/no adnexal mass.

Pulsed Doppler evaluation of both ovaries demonstrates normal
low-resistance arterial and venous waveforms.

Other findings

Trace pelvic free fluid likely physiologic.
IMPRESSION: 1. No evidence of ovarian torsion.
2. Malpositioned intrauterine device located in the lower uterine
segment extending into the cervical canal.

## 2022-09-10 ENCOUNTER — Emergency Department
Admission: EM | Admit: 2022-09-10 | Discharge: 2022-09-10 | Disposition: A | Discharge status: Home / Self Care | Payer: 59 | Attending: Emergency Medicine | Admitting: Emergency Medicine

## 2022-09-10 ENCOUNTER — Other Ambulatory Visit: Payer: Self-pay

## 2022-09-10 ENCOUNTER — Emergency Department: Payer: 59

## 2022-09-10 DIAGNOSIS — R1030 Lower abdominal pain, unspecified: Secondary | ICD-10-CM

## 2022-09-10 DIAGNOSIS — R1032 Left lower quadrant pain: Secondary | ICD-10-CM | POA: Insufficient documentation

## 2022-09-10 LAB — URINALYSIS, ROUTINE W REFLEX MICROSCOPIC
Bilirubin Urine: NEGATIVE
Glucose, UA: NEGATIVE mg/dL
Hgb urine dipstick: NEGATIVE
Ketones, ur: NEGATIVE mg/dL
Leukocytes,Ua: NEGATIVE
Nitrite: NEGATIVE
Protein, ur: NEGATIVE mg/dL
Specific Gravity, Urine: 1.01 (ref 1.005–1.030)
pH: 6 (ref 5.0–8.0)

## 2022-09-10 LAB — COMPREHENSIVE METABOLIC PANEL
ALT: 29 U/L (ref 0–44)
AST: 22 U/L (ref 15–41)
Albumin: 4.5 g/dL (ref 3.5–5.0)
Alkaline Phosphatase: 69 U/L (ref 38–126)
Anion gap: 8 (ref 5–15)
BUN: 10 mg/dL (ref 6–20)
CO2: 26 mmol/L (ref 22–32)
Calcium: 9.5 mg/dL (ref 8.9–10.3)
Chloride: 107 mmol/L (ref 98–111)
Creatinine, Ser: 0.8 mg/dL (ref 0.44–1.00)
GFR, Estimated: >60 mL/min (ref >60)
Glucose, Bld: 110 mg/dL — ABNORMAL HIGH (ref 70–99)
Potassium: 3.9 mmol/L (ref 3.5–5.1)
Sodium: 141 mmol/L (ref 135–145)
Total Bilirubin: 0.7 mg/dL (ref 0.3–1.2)
Total Protein: 7.9 g/dL (ref 6.5–8.1)

## 2022-09-10 LAB — HCG, QUANTITATIVE, PREGNANCY: hCG, Beta Chain, Quant, S: <1 m[IU]/mL (ref <5)

## 2022-09-10 LAB — POC URINE PREG, ED: Preg Test, Ur: NEGATIVE

## 2022-09-10 LAB — CBC
HCT: 45.6 % (ref 36.0–46.0)
Hemoglobin: 15.7 g/dL — ABNORMAL HIGH (ref 12.0–15.0)
MCH: 30.5 pg (ref 26.0–34.0)
MCHC: 34.4 g/dL (ref 30.0–36.0)
MCV: 88.5 fL (ref 80.0–100.0)
Platelets: 429 10*3/uL — ABNORMAL HIGH (ref 150–400)
RBC: 5.15 MIL/uL — ABNORMAL HIGH (ref 3.87–5.11)
RDW: 12.3 % (ref 11.5–15.5)
WBC: 9.4 10*3/uL (ref 4.0–10.5)
nRBC: 0 % (ref 0.0–0.2)

## 2022-09-10 LAB — LIPASE, BLOOD: Lipase: 65 U/L — ABNORMAL HIGH (ref 11–51)

## 2022-09-10 NOTE — ED Notes (Signed)
Pt in ultrasound right now. Ultrasound confirmed they would bring patient to room 53 once they are complete.

## 2022-09-10 NOTE — ED Notes (Signed)
See triage note  Presents with a 3 day hx of LLQ pain  states pain has been constant  No fever or urinary sxs'

## 2022-09-10 NOTE — ED Triage Notes (Signed)
Pt sts that she has been having left lower abd pain. Per pt her provider is concerned about pt having an ectopic pregnancy. Pt sts that she did an exam in the office but wants her to come over here and get a [place].

## 2022-09-10 NOTE — ED Provider Triage Note (Signed)
Emergency Medicine Provider Triage Evaluation Note  Brittany [name] , a 36 y.o. female  was evaluated in triage.  Pt complains of pelvic pain with concern for possible ectopic.  She reports her LMP was October 5.  She reports a positive home pregnancy test.  Review of Systems  Positive: Pelvic pain, positive home UPT Negative: NCD  Physical Exam  BP 126/67 (BP Location: Left Arm)   Pulse 83   Temp 98.6 F (37 C) (Oral)   Resp 17   Wt 79.4 kg   LMP 07/26/2022 (Exact Date)   SpO2 98%   BMI 31.00 kg/m  Gen:   Awake, no distress  NAD Resp:  Normal effort CTA MSK:   Moves extremities without difficulty  Other:    Medical Decision Making  Medically screening exam initiated at 4:45 PM.  Appropriate orders placed.  Britt Bottom was informed that the remainder of the evaluation will be completed by another provider, this initial triage assessment does not replace that evaluation, and the importance of remaining in the ED until their evaluation is complete.  Patient to the ED for evaluation of pelvic pain with concern for possible early pregnancy loss versus ectopic pregnancy.   Lissa Hoard, PA-C 09/10/22 1650

## 2022-09-10 NOTE — ED Provider Notes (Signed)
[facility] Provider Note    Event Date/Time   First MD Initiated Contact with Patient 09/10/22 1745     (approximate)   History   Abdominal Pain   HPI  Brittany [name] is a 36 y.o. female who presents with several days of left lower quadrant abdominal discomfort.  Had exam by PCP who referred in for ultrasound to evaluate for ovarian cyst.  No nausea or vomiting.  Normal stools.  No dysuria.     Physical Exam   Triage Vital Signs: ED Triage Vitals  Enc Vitals Group     BP 09/10/22 1624 126/67     Pulse Rate 09/10/22 1624 83     Resp 09/10/22 1624 17     Temp 09/10/22 1624 98.6 F (37 C)     Temp Source 09/10/22 1624 Oral     SpO2 09/10/22 1624 98 %     Weight 09/10/22 1625 79.4 kg (175 lb)     Height 09/10/22 1759 1.6 m (5\' 3" )     Head Circumference --      Peak Flow --      Pain Score 09/10/22 1625 6     Pain Loc --      Pain Edu? --      Excl. in GC? --     Most recent vital signs: Vitals:   09/10/22 1624 09/10/22 1918  BP: 126/67 119/72  Pulse: 83 73  Resp: 17 20  Temp: 98.6 F (37 C) 98.4 F (36.9 C)  SpO2: 98% 99%     General: Awake, no distress.  CV:  Good peripheral perfusion.  Resp:  Normal effort.  Abd:  No distention.  Mild tenderness left lower quadrant Other:  GU exam deferred given just done by PCP   ED Results / Procedures / Treatments   Labs (all labs ordered are listed, but only abnormal results are displayed) Labs Reviewed  LIPASE, BLOOD - Abnormal; Notable for the following components:      Result Value   Lipase 65 (*)    All other components within normal limits  COMPREHENSIVE METABOLIC PANEL - Abnormal; Notable for the following components:   Glucose, Bld 110 (*)    All other components within normal limits  CBC - Abnormal; Notable for the following components:   RBC 5.15 (*)    Hemoglobin 15.7 (*)    Platelets 429 (*)    All other components within normal limits  URINALYSIS, ROUTINE W REFLEX  MICROSCOPIC - Abnormal; Notable for the following components:   Color, Urine STRAW (*)    APPearance CLEAR (*)    All other components within normal limits  HCG, QUANTITATIVE, PREGNANCY  POC URINE PREG, ED     EKG     RADIOLOGY Ultrasound negative for torsion or abnormality    PROCEDURES:  Critical Care performed:   Procedures   MEDICATIONS ORDERED IN ED: Medications - No data to display   IMPRESSION / MDM / ASSESSMENT AND PLAN / ED COURSE  I reviewed the triage vital signs and the nursing notes. Patient's presentation is most consistent with acute presentation with potential threat to life or bodily function.   Patient presents with left lower quadrant pain as above.  Differential includes ovarian cyst, ovarian torsion, less likely diverticulitis.  Ultrasound is negative for abnormality, lab work reviewed and is reassuring.  Pregnancy test is negative.  Urinalysis is normal.  Recommended CT scan however patient decided to forego extra testing at this  time, she states that she will come back if any worsening       FINAL CLINICAL IMPRESSION(S) / ED DIAGNOSES   Final diagnoses:  Lower abdominal pain     Rx / DC Orders   ED Discharge Orders     None        Note:  This document was prepared using Dragon voice recognition software and may include unintentional dictation errors.   Jene Every, MD 09/10/22 2228

## 2023-08-01 ENCOUNTER — Other Ambulatory Visit: Payer: Self-pay

## 2023-08-01 ENCOUNTER — Emergency Department: Payer: 59

## 2023-08-01 ENCOUNTER — Emergency Department
Admission: EM | Admit: 2023-08-01 | Discharge: 2023-08-01 | Disposition: A | Discharge status: Home / Self Care | Payer: 59 | Attending: Emergency Medicine | Admitting: Emergency Medicine

## 2023-08-01 DIAGNOSIS — K219 Gastro-esophageal reflux disease without esophagitis: Secondary | ICD-10-CM | POA: Diagnosis not present

## 2023-08-01 DIAGNOSIS — R0789 Other chest pain: Secondary | ICD-10-CM | POA: Insufficient documentation

## 2023-08-01 LAB — CBC
HCT: 41.9 % (ref 36.0–46.0)
Hemoglobin: 14.2 g/dL (ref 12.0–15.0)
MCH: 30.7 pg (ref 26.0–34.0)
MCHC: 33.9 g/dL (ref 30.0–36.0)
MCV: 90.5 fL (ref 80.0–100.0)
Platelets: 326 10*3/uL (ref 150–400)
RBC: 4.63 MIL/uL (ref 3.87–5.11)
RDW: 12.3 % (ref 11.5–15.5)
WBC: 6.7 10*3/uL (ref 4.0–10.5)
nRBC: 0 % (ref 0.0–0.2)

## 2023-08-01 LAB — TROPONIN I (HIGH SENSITIVITY): Troponin I (High Sensitivity): 3 ng/L (ref <18)

## 2023-08-01 LAB — BASIC METABOLIC PANEL
Anion gap: 10 (ref 5–15)
BUN: 11 mg/dL (ref 6–20)
CO2: 21 mmol/L — ABNORMAL LOW (ref 22–32)
Calcium: 8.3 mg/dL — ABNORMAL LOW (ref 8.9–10.3)
Chloride: 102 mmol/L (ref 98–111)
Creatinine, Ser: 0.85 mg/dL (ref 0.44–1.00)
GFR, Estimated: >60 mL/min (ref >60)
Glucose, Bld: 198 mg/dL — ABNORMAL HIGH (ref 70–99)
Potassium: 3.5 mmol/L (ref 3.5–5.1)
Sodium: 133 mmol/L — ABNORMAL LOW (ref 135–145)

## 2023-08-01 NOTE — ED Provider Notes (Signed)
[facility] Provider Note    Event Date/Time   First MD Initiated Contact with Patient 08/01/23 1536     (approximate)   History   Chest Pain   HPI  Brittany [name] is a 37 y.o. female with a history of migraines and GERD who presents with chest pain over the last 3 days, intermittent course, described as pressure or squeezing.  She states that it will come on for about 5 minutes and then fade away.  It is not exertional.  She denies any associated shortness of breath, lightheadedness, or nausea, but states that she has had generalized fatigue over the last few days.  She has no leg swelling or pain.  The patient states that her symptoms may be due to stress and anxiety; she has been undergoing increased rest recently due to her husband having a severe illness.  She tried to take her anxiety medication but states it did not help much.  I reviewed the past medical records.  The patient was most recently seen in the ED in November of last year with left lower quadrant pain.  Previously, her most recent documented on ED outpatient encounter was with surgery in 2018 for follow-up after laparoscopic cholecystectomy.   Physical Exam   Triage Vital Signs: ED Triage Vitals  Encounter Vitals Group     BP 08/01/23 1337 133/61     Systolic BP Percentile --      Diastolic BP Percentile --      Pulse Rate 08/01/23 1337 94     Resp 08/01/23 1337 16     Temp 08/01/23 1337 98.3 F (36.8 C)     Temp Source 08/01/23 1337 Oral     SpO2 08/01/23 1337 98 %     Weight 08/01/23 1334 169 lb (76.7 kg)     Height 08/01/23 1334 5\' 3"  (1.6 m)     Head Circumference --      Peak Flow --      Pain Score 08/01/23 1334 (S) 0     Pain Loc --      Pain Education --      Exclude from Growth Chart --     Most recent vital signs: Vitals:   08/01/23 1337  BP: 133/61  Pulse: 94  Resp: 16  Temp: 98.3 F (36.8 C)  SpO2: 98%     General: Awake, no distress.  CV:  Good  peripheral perfusion.  Normal heart sounds. Resp:  Normal effort.  Lungs CTAB. Abd:  No distention.  Other:  No calf or popliteal swelling or tenderness.   ED Results / Procedures / Treatments   Labs (all labs ordered are listed, but only abnormal results are displayed) Labs Reviewed  BASIC METABOLIC PANEL - Abnormal; Notable for the following components:      Result Value   Sodium 133 (*)    CO2 21 (*)    Glucose, Bld 198 (*)    Calcium 8.3 (*)    All other components within normal limits  CBC  POC URINE PREG, ED  TROPONIN I (HIGH SENSITIVITY)     EKG  ED ECG REPORT I, Dionne Bucy, the attending physician, personally viewed and interpreted this ECG.  Date: 08/01/2023 EKG Time: 1339 Rate: 91 Rhythm: normal sinus rhythm QRS Axis: normal Intervals: normal ST/T Wave abnormalities: Nonspecific T wave abnormality Narrative Interpretation: no evidence of acute ischemia; no significant change when compared to EKG of 10/28/2016    RADIOLOGY  Chest  x-ray: I independently viewed and interpreted the images; there is no focal consolidation or edema   PROCEDURES:  Critical Care performed: No  Procedures   MEDICATIONS ORDERED IN ED: Medications - No data to display   IMPRESSION / MDM / ASSESSMENT AND PLAN / ED COURSE  I reviewed the triage vital signs and the nursing notes.  37 year old female with PMH as noted above presents with intermittent, atypical nonexertional chest pain in the context of increased stress.  There are no significant associated symptoms other than generalized fatigue.  Physical exam is unremarkable for acute findings.  EKG is nonischemic.  Troponin is negative.  BMP shows normal electrolytes.  Differential diagnosis includes, but is not limited to, anxiety, stress, musculoskeletal pain, GERD.  There is no evidence of ACS.  Based on the duration of the symptoms there is no indication for serial troponins.  The patient is PERC negative.   Presentation is not consistent with vascular etiology.  Patient's presentation is most consistent with acute complicated illness / injury requiring diagnostic workup.  At this time, given the negative workup, the patient is stable for discharge home with outpatient follow-up with her primary care provider.  I gave strict return precautions and she expressed understanding.   FINAL CLINICAL IMPRESSION(S) / ED DIAGNOSES   Final diagnoses:  Atypical chest pain     Rx / DC Orders   ED Discharge Orders     None        Note:  This document was prepared using Dragon voice recognition software and may include unintentional dictation errors.    Dionne Bucy, MD 08/01/23 1651

## 2023-08-01 NOTE — ED Triage Notes (Addendum)
Pt to ed from home via POV for chest pain x 3 days. Pt states "I have been under a lot of stress as my husband was recently here and diagnosed with aneurysm". Pt is caox4, in no acute distress and ambulatory in triage. Pt does have HX of anxiety and takes BUSPAR as needed.

## 2023-08-01 NOTE — Discharge Instructions (Addendum)
Return to the ER for new, worsening, or persistent severe chest pain, difficulty breathing, lightheadedness, palpitations, or any other new or worsening symptoms that concern you.

## 2024-07-06 ENCOUNTER — Other Ambulatory Visit: Payer: Self-pay

## 2024-07-06 ENCOUNTER — Emergency Department: Payer: PRIVATE HEALTH INSURANCE | Admitting: Certified Registered Nurse Anesthetist

## 2024-07-06 ENCOUNTER — Ambulatory Visit
Admission: EM | Admit: 2024-07-06 | Discharge: 2024-07-06 | Disposition: A | Discharge status: Home / Self Care | Payer: PRIVATE HEALTH INSURANCE | Attending: Emergency Medicine | Admitting: Emergency Medicine

## 2024-07-06 ENCOUNTER — Encounter
Admission: EM | Disposition: A | Discharge status: Home / Self Care | Payer: Self-pay | Source: Home / Self Care | Attending: Emergency Medicine

## 2024-07-06 DIAGNOSIS — K219 Gastro-esophageal reflux disease without esophagitis: Secondary | ICD-10-CM | POA: Insufficient documentation

## 2024-07-06 DIAGNOSIS — K2289 Other specified disease of esophagus: Secondary | ICD-10-CM | POA: Insufficient documentation

## 2024-07-06 DIAGNOSIS — K449 Diaphragmatic hernia without obstruction or gangrene: Secondary | ICD-10-CM | POA: Insufficient documentation

## 2024-07-06 DIAGNOSIS — W44F3XA Food entering into or through a natural orifice, initial encounter: Secondary | ICD-10-CM | POA: Diagnosis not present

## 2024-07-06 DIAGNOSIS — T18128A Food in esophagus causing other injury, initial encounter: Secondary | ICD-10-CM | POA: Diagnosis present

## 2024-07-06 DIAGNOSIS — F32A Depression, unspecified: Secondary | ICD-10-CM | POA: Diagnosis not present

## 2024-07-06 DIAGNOSIS — F419 Anxiety disorder, unspecified: Secondary | ICD-10-CM | POA: Diagnosis not present

## 2024-07-06 DIAGNOSIS — D721 Eosinophilia, unspecified: Secondary | ICD-10-CM | POA: Insufficient documentation

## 2024-07-06 DIAGNOSIS — Z87891 Personal history of nicotine dependence: Secondary | ICD-10-CM | POA: Insufficient documentation

## 2024-07-06 HISTORY — PX: FOREIGN BODY REMOVAL: SHX962

## 2024-07-06 HISTORY — PX: ESOPHAGOGASTRODUODENOSCOPY: SHX5428

## 2024-07-06 HISTORY — DX: Food entering into or through a natural orifice, initial encounter: W44.F3XA

## 2024-07-06 LAB — CBC
HCT: 43.2 % (ref 36.0–46.0)
Hemoglobin: 14.7 g/dL (ref 12.0–15.0)
MCH: 30.5 pg (ref 26.0–34.0)
MCHC: 34 g/dL (ref 30.0–36.0)
MCV: 89.6 fL (ref 80.0–100.0)
Platelets: 390 K/uL (ref 150–400)
RBC: 4.82 MIL/uL (ref 3.87–5.11)
RDW: 12.4 % (ref 11.5–15.5)
WBC: 11.4 K/uL — ABNORMAL HIGH (ref 4.0–10.5)
nRBC: 0 % (ref 0.0–0.2)

## 2024-07-06 LAB — BASIC METABOLIC PANEL WITH GFR
Anion gap: 11 (ref 5–15)
BUN: 18 mg/dL (ref 6–20)
CO2: 22 mmol/L (ref 22–32)
Calcium: 8.8 mg/dL — ABNORMAL LOW (ref 8.9–10.3)
Chloride: 105 mmol/L (ref 98–111)
Creatinine, Ser: 0.8 mg/dL (ref 0.44–1.00)
GFR, Estimated: >60 mL/min (ref >60)
Glucose, Bld: 127 mg/dL — ABNORMAL HIGH (ref 70–99)
Potassium: 3.5 mmol/L (ref 3.5–5.1)
Sodium: 138 mmol/L (ref 135–145)

## 2024-07-06 LAB — HCG, QUANTITATIVE, PREGNANCY: hCG, Beta Chain, Quant, S: <1 m[IU]/mL (ref <5)

## 2024-07-06 LAB — POC URINE PREG, ED: Preg Test, Ur: NEGATIVE

## 2024-07-06 SURGERY — EGD (ESOPHAGOGASTRODUODENOSCOPY)
Anesthesia: General

## 2024-07-06 MED ORDER — FENTANYL CITRATE (PF) 100 MCG/2ML IJ SOLN
INTRAMUSCULAR | Status: AC
  Filled 2024-07-06: qty 2

## 2024-07-06 MED ORDER — OMEPRAZOLE 40 MG PO CPDR: 40.0000 mg | DELAYED_RELEASE_CAPSULE | Freq: Every day | ORAL | 2 refills | Status: AC

## 2024-07-06 MED ORDER — ONDANSETRON HCL 4 MG/2ML IJ SOLN
INTRAMUSCULAR | Status: AC
  Filled 2024-07-06: qty 2

## 2024-07-06 MED ORDER — DEXAMETHASONE SODIUM PHOSPHATE 10 MG/ML IJ SOLN
INTRAMUSCULAR | Status: AC
  Filled 2024-07-06: qty 1

## 2024-07-06 MED ORDER — DEXAMETHASONE SODIUM PHOSPHATE 10 MG/ML IJ SOLN
INTRAMUSCULAR | Status: DC | PRN
  Administered 2024-07-06: 10 mg via INTRAVENOUS

## 2024-07-06 MED ORDER — SUCCINYLCHOLINE CHLORIDE 200 MG/10ML IV SOSY
PREFILLED_SYRINGE | INTRAVENOUS | Status: DC | PRN
  Administered 2024-07-06: 100 mg via INTRAVENOUS

## 2024-07-06 MED ORDER — PROPOFOL 10 MG/ML IV BOLUS
INTRAVENOUS | Status: AC
  Filled 2024-07-06: qty 20

## 2024-07-06 MED ORDER — LIDOCAINE HCL (CARDIAC) PF 100 MG/5ML IV SOSY
PREFILLED_SYRINGE | INTRAVENOUS | Status: DC | PRN
  Administered 2024-07-06: 100 mg via INTRAVENOUS

## 2024-07-06 MED ORDER — PANTOPRAZOLE SODIUM 40 MG IV SOLR
40.0000 mg | Freq: Once | INTRAVENOUS | Status: AC
  Administered 2024-07-06: 40 mg via INTRAVENOUS
  Filled 2024-07-06: qty 10

## 2024-07-06 MED ORDER — LACTATED RINGERS IV SOLN: INTRAVENOUS | Status: DC

## 2024-07-06 MED ORDER — PROPOFOL 10 MG/ML IV BOLUS
INTRAVENOUS | Status: DC | PRN
  Administered 2024-07-06: 150 mg via INTRAVENOUS
  Administered 2024-07-06: 150 ug/kg/min via INTRAVENOUS

## 2024-07-06 MED ORDER — ONDANSETRON HCL 4 MG/2ML IJ SOLN
INTRAMUSCULAR | Status: DC | PRN
  Administered 2024-07-06: 4 mg via INTRAVENOUS

## 2024-07-06 NOTE — Op Note (Signed)
 [facility] Gastroenterology Patient Name: Brittany [name] Procedure Date: 07/06/2024 10:03 PM MRN: [id number] Account #: [id number] Date of Birth: 02-04-1986 Admit Type: Outpatient Age: 38 Room: [facility] ENDO ROOM 4 Gender: Female Note Status: Finalized Instrument Name: Barnie GI Scope [id number] Procedure:             Upper GI endoscopy Indications:           Foreign body in the esophagus Providers:             Corinn Jess Brooklyn MD, MD Referring MD:          Charlene CROME. Hamrick (Referring MD) Medicines:             General Anesthesia Complications:         No immediate complications. Estimated blood loss: None. Procedure:             Pre-Anesthesia Assessment:                        - Prior to the procedure, a History and Physical was                         performed, and patient medications and allergies were                         reviewed. The patient is competent. The risks and                         benefits of the procedure and the sedation options and                         risks were discussed with the patient. All questions                         were answered and informed consent was obtained.                         Patient identification and proposed procedure were                         verified by the physician, the nurse, the                         anesthesiologist, the anesthetist and the technician                         in the pre-procedure area in the procedure room in the                         endoscopy suite. Mental Status Examination: alert and                         oriented. Airway Examination: normal oropharyngeal                         airway and neck mobility. Respiratory Examination:                         clear to auscultation. CV Examination: normal.  Prophylactic Antibiotics: The patient does not require                         prophylactic antibiotics. Prior Anticoagulants: The                         patient  has taken no anticoagulant or antiplatelet                         agents. ASA Grade Assessment: II - A patient with mild                         systemic disease. After reviewing the risks and                         benefits, the patient was deemed in satisfactory                         condition to undergo the procedure. The anesthesia                         plan was to use general anesthesia. Immediately prior                         to administration of medications, the patient was                         re-assessed for adequacy to receive sedatives. The                         heart rate, respiratory rate, oxygen saturations,                         blood pressure, adequacy of pulmonary ventilation, and                         response to care were monitored throughout the                         procedure. The physical status of the patient was                         re-assessed after the procedure.                        After obtaining informed consent, the endoscope was                         passed under direct vision. Throughout the procedure,                         the patient's blood pressure, pulse, and oxygen                         saturations were monitored continuously. The Endoscope                         was introduced through the mouth, and advanced to the  second part of duodenum. The upper GI endoscopy was                         accomplished without difficulty. The patient tolerated                         the procedure well. Findings:      Food was found in the mid esophagus. Removal was accomplished with a       Raptor grasping device. Estimated blood loss: none.      Diffuse mild inflammation characterized by congestion (edema) and       friability was found in the middle third of the esophagus and in the       lower third of the esophagus. Biopsies were taken with a cold forceps       for histology.      The entire examined  stomach was normal.      A small hiatal hernia was present.      The duodenal bulb and second portion of the duodenum were normal. Impression:            - Food in the mid esophagus. Removal was successful.                        - Esophageal mucosal changes were present, including                         congestion (edema) and friability. Findings are                         suggestive of inflammation. Biopsied.                        - Normal stomach.                        - Small hiatal hernia.                        - Normal duodenal bulb and second portion of the                         duodenum. Recommendation:        - Await pathology results.                        - Discharge patient to home (with parent).                        - Chopped diet today.                        - Continue present medications.                        - Follow an antireflux regimen indefinitely.                        - Use Prilosec (omeprazole ) 40 mg PO daily                         indefinitely.                        -  Return to my office at appointment to be scheduled. Procedure Code(s):     --- Professional ---                        [id number], Esophagogastroduodenoscopy, flexible,                         transoral; with removal of foreign body(s)                        43239, Esophagogastroduodenoscopy, flexible,                         transoral; with biopsy, single or multiple Diagnosis Code(s):     --- Professional ---                        U81.871J, Food in esophagus causing other injury,                         initial encounter                        K22.89, Other specified disease of esophagus                        K44.9, Diaphragmatic hernia without obstruction or                         gangrene                        T18.108A, Unspecified foreign body in esophagus                         causing other injury, initial encounter CPT copyright 2022 American [employer] Association. All rights  reserved. The codes documented in this report are preliminary and upon coder review may  be revised to meet current compliance requirements. Dr. Corinn Brooklyn Corinn Jess Brooklyn MD, MD 07/06/2024 10:27:29 PM This report has been signed electronically. Number of Addenda: 0 Note Initiated On: 07/06/2024 10:03 PM Estimated Blood Loss:  Estimated blood loss: none.      [facility]

## 2024-07-06 NOTE — Consult Note (Signed)
 Brittany JONELLE Brooklyn, MD [address]  [place], [place] [address]  Main: [phone] Fax:  [phone] Pager: [phone]   Consultation  Referring Provider:     No ref. provider found Primary Care Physician:  [facility] Primary Gastroenterologist:   Sampson         Reason for Consultation: Food impaction  Date of Admission:  07/06/2024 Date of Consultation:  07/06/2024         HPI:   Brittany [name] is a 38 y.o. female with history of chronic GERD, intermittent episodes of dysphagia to bread and meat presented tonight after she had piece of steak stuck in her throat which occurred at 11 AM and it has been stuck since then.  She is not able to keep anything down.  She came to ER around 7:30 PM.  She had prior episodes in the past and was able to let food bolus pass with Coke.  She report strong family history of her mom and her aunts having history of difficulty swallowing.  Patient takes PPI for acid reflux intermittently which provides relief.  She denies having EGD in the past.  Has not seen GI in the past Does not smoke or drink alcohol   NSAIDs: None  Antiplts/Anticoagulants/Anti thrombotics: None  GI Procedures: None  Past Medical History:  Diagnosis Date   Anxiety    Complication of anesthesia    Depression    GERD (gastroesophageal reflux disease)    Headache    H/O MIGRAINES   Heart murmur    AS A CHILD-PT STATES SHE HAS OUTGROWN MURMUR AND NEVER HAD A PROBLEM WITH IT AS A CHILD   History of methicillin resistant staphylococcus aureus (MRSA) 2008   LEG   PONV (postoperative nausea and vomiting)     Past Surgical History:  Procedure Laterality Date   CESAREAN SECTION N/A 02/2009   CESAREAN SECTION N/A 06/11/2016   Procedure: REPEAT CESAREAN SECTION;  Surgeon: Glory High, MD;  Location: [facility] ORS;  Service: Obstetrics;  Laterality: N/A;  Female born @ [birth year] Wt: 7lb 7oz Apgars: 8/9   CHOLECYSTECTOMY N/A 11/16/2016   Procedure:  LAPAROSCOPIC CHOLECYSTECTOMY;  Surgeon: Carlin Pastel, MD;  Location: [facility] ORS;  Service: General;  Laterality: N/A;   TONSILLECTOMY       Current Facility-Administered Medications:    lactated ringers  infusion, , Intravenous, Continuous, Tan, Lorelle Cummins, MD, Last Rate: 125 mL/hr at 07/06/24 2046, New Bag at 07/06/24 2046   Family History  Problem Relation Age of Onset   Thrombosis Mother    Healthy Father    Cancer Maternal Grandmother        breast   Cancer Paternal Grandfather        colon     Social History   Tobacco Use   Smoking status: Former    Current packs/day: 0.00    Average packs/day: 0.3 packs/day for 10.0 years (2.5 ttl pk-yrs)    Types: Cigarettes    Start date: 11/09/2004    Quit date: 11/09/2014    Years since quitting: 9.6   Smokeless tobacco: Never  Substance Use Topics   Alcohol use: Yes    Comment: WINE RARELY   Drug use: No    Allergies as of 07/06/2024 - Review Complete 07/06/2024  Allergen Reaction Noted   Clindamycin Shortness Of Breath 02/28/2016   Codeine Nausea Only 10/30/2016    Review of Systems:    All systems reviewed and negative except where noted in HPI.  Physical Exam:  Vital signs in last 24 hours: Temp:  [97 F (36.1 C)-98.7 F (37.1 C)] 97 F (36.1 C) (09/15 2159) Pulse Rate:  [73-83] 73 (09/15 2159) Resp:  [16] 16 (09/15 2159) BP: (95-112)/(51-64) 95/51 (09/15 2159) SpO2:  [99 %-100 %] 100 % (09/15 2159) Weight:  [76.7 kg] 76.7 kg (09/15 1933)   General:   Alert,  Well-developed, well-nourished, pleasant and cooperative in NAD Eyes:  Sclera clear, no icterus.   Conjunctiva pink. Lungs:  Respirations even and unlabored.  Clear throughout to auscultation.   No wheezes, crackles, or rhonchi. No acute distress. Heart:  Regular rate and rhythm; no murmurs, clicks, rubs, or gallops. Abdomen:  Normal bowel sounds. Soft, non-tender and non-distended without masses, hepatosplenomegaly or hernias noted.  No guarding or rebound  tenderness.   Rectal: Not performed Extremities:  No clubbing or edema.  No cyanosis. Neurologic:  Alert and oriented x3 Skin:  Intact without significant lesions or rashes. No jaundice. Psych:  Alert and cooperative. Normal mood and affect.  LAB RESULTS:    Latest Ref Rng & Units 07/06/2024    7:41 PM 08/01/2023    1:37 PM 09/10/2022    4:27 PM  CBC  WBC 4.0 - 10.5 K/uL 11.4  6.7  9.4   Hemoglobin 12.0 - 15.0 g/dL 85.2  85.7  84.2   Hematocrit 36.0 - 46.0 % 43.2  41.9  45.6   Platelets 150 - 400 K/uL 390  326  429     BMET    Latest Ref Rng & Units 07/06/2024    7:41 PM 08/01/2023    1:37 PM 09/10/2022    4:27 PM  BMP  Glucose 70 - 99 mg/dL 872  801  889   BUN 6 - 20 mg/dL 18  11  10    Creatinine 0.44 - 1.00 mg/dL 9.19  9.14  9.19   Sodium 135 - 145 mmol/L 138  133  141   Potassium 3.5 - 5.1 mmol/L 3.5  3.5  3.9   Chloride 98 - 111 mmol/L 105  102  107   CO2 22 - 32 mmol/L 22  21  26    Calcium 8.9 - 10.3 mg/dL 8.8  8.3  9.5     LFT    Latest Ref Rng & Units 09/10/2022    4:27 PM 07/26/2019    8:08 AM 10/28/2016   12:44 AM  Hepatic Function  Total Protein 6.5 - 8.1 g/dL 7.9  8.0  7.5   Albumin 3.5 - 5.0 g/dL 4.5  4.6  4.2   AST 15 - 41 U/L 22  22  160   ALT 0 - 44 U/L 29  37  119   Alk Phosphatase 38 - 126 U/L 69  77  134   Total Bilirubin 0.3 - 1.2 mg/dL 0.7  0.7  0.9      STUDIES: No results found.    Impression / Plan:   Brittany [name] is a 38 y.o. female with history of chronic GERD presented with a food bolus after she ate a steak this morning  Discussed about upper endoscopy for further evaluation and possible removal of food bolus.  I suspect the food bolus may have passed Recommend long-term PPI Recommend follow-up with GI as outpatient Recommend to avoid hard meats  I have discussed alternative options, risks & benefits,  which include, but are not limited to, bleeding, infection, perforation,respiratory complication & drug reaction.  The patient  agrees with this  plan & written consent will be obtained.     Thank you for involving me in the care of this patient.      LOS: 0 days   Brittany Brooklyn, MD  07/06/2024, 10:07 PM    Note: This dictation was prepared with Dragon dictation along with smaller phrase technology. Any transcriptional errors that result from this process are unintentional.

## 2024-07-06 NOTE — ED Provider Notes (Signed)
 [name] Provider Note    Event Date/Time   First MD Initiated Contact with Patient 07/06/24 1940     (approximate)   History   Food Bolus   HPI  Brittany [name] is a 38 y.o. female with history of GERD, anxiety, presenting with food impaction.  Patient states that she ate a piece of steak, felt stuck in her chest.  Is tolerating her secretions, states that she tried a bit of Coke and was able to get that down but after that tried some fries, felt the fries get stuck in her chest and regurgitated out.  States that she feels asymptomatic at this time.  Has had prior histories of food impaction that she was able to self resolve with Coke.    Per independent history from mom, they do have family history of food impaction in the setting of GERD for which her mom takes PPI.  Patient has not had an EGD done in the past.      Physical Exam   Triage Vital Signs: ED Triage Vitals  Encounter Vitals Group     BP 07/06/24 1935 112/64     Girls Systolic BP Percentile --      Girls Diastolic BP Percentile --      Boys Systolic BP Percentile --      Boys Diastolic BP Percentile --      Pulse Rate 07/06/24 1935 83     Resp 07/06/24 1935 16     Temp 07/06/24 1935 98.7 F (37.1 C)     Temp Source 07/06/24 1935 Oral     SpO2 07/06/24 1935 99 %     Weight 07/06/24 1933 169 lb (76.7 kg)     Height --      Head Circumference --      Peak Flow --      Pain Score 07/06/24 1933 5     Pain Loc --      Pain Education --      Exclude from Growth Chart --     Most recent vital signs: Vitals:   07/06/24 1935  BP: 112/64  Pulse: 83  Resp: 16  Temp: 98.7 F (37.1 C)  SpO2: 99%     General: Awake, no distress.  CV:  Good peripheral perfusion.  Resp:  Normal effort.  Abd:  No distention.  Soft nontender Other:  Maintaining her secretions, nontoxic-appearing   ED Results / Procedures / Treatments   Labs (all labs ordered are listed, but only abnormal  results are displayed) Labs Reviewed  CBC - Abnormal; Notable for the following components:      Result Value   WBC 11.4 (*)    All other components within normal limits  BASIC METABOLIC PANEL WITH GFR - Abnormal; Notable for the following components:   Glucose, Bld 127 (*)    Calcium 8.8 (*)    All other components within normal limits  POC URINE PREG, ED - Normal  HCG, QUANTITATIVE, PREGNANCY      PROCEDURES:  Critical Care performed: No  Procedures   MEDICATIONS ORDERED IN ED: Medications  lactated ringers  infusion ( Intravenous New Bag/Given 07/06/24 2046)  pantoprazole  (PROTONIX ) injection 40 mg (40 mg Intravenous Given 07/06/24 2044)     IMPRESSION / MDM / ASSESSMENT AND PLAN / ED COURSE  I reviewed the triage vital signs and the nursing notes.  Differential diagnosis includes, but is not limited to, food impaction, partial food impaction, acid reflux, GERD, esophagitis.  Get labs, pregnancy test,  Patient's presentation is most consistent with acute presentation with potential threat to life or bodily function.  Had patient drink some soda, she was able to get it down without recurrence of symptoms.  Will monitor and make sure that she is able to tolerate food.  Discussed with her that she will need outpatient follow-up with GI as well as to start PPI.  She is agreeable with this plan.  Patient states that she regurgitated the Coke and saw small pieces of steak.  Discussed with GI who will come down to see the patient and take her to the Endo suite.  Will keep her NPO.  Will give her some maintenance fluids.  Shared decision making done with patient and family and they are agreeable with this plan.   Independent interpretation of labs below.    Clinical Course as of 07/06/24 2100  Mon Jul 06, 2024  2042 Independent review of labs, pregnancy test is negative, mild leukocytosis but she has no infectious symptoms, electrolytes not  severely deranged. [TT]    Clinical Course User Index [TT] Waymond Lorelle Cummins, MD     FINAL CLINICAL IMPRESSION(S) / ED DIAGNOSES   Final diagnoses:  Food impaction of esophagus, initial encounter     Rx / DC Orders   ED Discharge Orders          Ordered    Diet NPO time specified        07/06/24 2039             Note:  This document was prepared using Dragon voice recognition software and may include unintentional dictation errors.    Waymond Lorelle Cummins, MD 07/06/24 2100

## 2024-07-06 NOTE — ED Notes (Signed)
 Patient endorses food bolus since 11am. Has tried multiple sodas at home and even beer but ends up vomiting each time. Patient's speech is currently clear without labored/difficulty breathing.

## 2024-07-06 NOTE — Anesthesia Preprocedure Evaluation (Signed)
 Anesthesia Evaluation  Patient identified by MRN, date of birth, ID band Patient awake    Reviewed: Allergy & Precautions, H&P , NPO status , Patient's Chart, lab work & pertinent test results  History of Anesthesia Complications (+) PONV and history of anesthetic complications  Airway Mallampati: III  TM Distance: >3 FB Neck ROM: full    Dental no notable dental hx. (+) Chipped, Dental Advidsory Given, Teeth Intact   Pulmonary neg shortness of breath, neg COPD, neg recent URI, former smoker   Pulmonary exam normal breath sounds clear to auscultation       Cardiovascular Exercise Tolerance: Good (-) hypertension(-) angina (-) Past MI and (-) DOE Normal cardiovascular exam(-) dysrhythmias + Valvular Problems/Murmurs (as a child, grew out of it)  Rhythm:regular Rate:Normal     Neuro/Psych  Headaches, neg Seizures PSYCHIATRIC DISORDERS Anxiety Depression       GI/Hepatic negative GI ROS, Neg liver ROS,GERD  Poorly Controlled,,  Endo/Other  negative endocrine ROS    Renal/GU      Musculoskeletal   Abdominal   Peds  Hematology negative hematology ROS (+)   Anesthesia Other Findings Past Medical History: No date: Anxiety No date: Complication of anesthesia No date: Depression No date: GERD (gastroesophageal reflux disease) No date: Headache     Comment: H/O MIGRAINES No date: Heart murmur     Comment: AS A CHILD-PT STATES SHE HAS OUTGROWN MURMUR               AND NEVER HAD A PROBLEM WITH IT AS A CHILD 2008: History of methicillin resistant staphylococcu*     Comment: LEG No date: PONV (postoperative nausea and vomiting)  Past Surgical History: 02/2009: CESAREAN SECTION N/A 06/11/2016: CESAREAN SECTION N/A     Comment: Procedure: REPEAT CESAREAN SECTION;  Surgeon:               Glory High, MD;  Location: [facility] ORS;                Service: Obstetrics;  Laterality: N/A;  Female               born @ [birth year] Wt: 7lb  7oz Apgars: 8/9 No date: TONSILLECTOMY     Reproductive/Obstetrics negative OB ROS                              Anesthesia Physical Anesthesia Plan  ASA: 2  Anesthesia Plan: General   Post-op Pain Management:    Induction: Intravenous, Rapid sequence and Cricoid pressure planned  PONV Risk Score and Plan: 4 or greater and Propofol  infusion, TIVA and Treatment may vary due to age or medical condition  Airway Management Planned: Oral ETT  Additional Equipment:   Intra-op Plan:   Post-operative Plan: Extubation in OR  Informed Consent: I have reviewed the patients History and Physical, chart, labs and discussed the procedure including the risks, benefits and alternatives for the proposed anesthesia with the patient or authorized representative who has indicated his/her understanding and acceptance.     Dental Advisory Given  Plan Discussed with: Anesthesiologist, CRNA and Surgeon  Anesthesia Plan Comments:          Anesthesia Quick Evaluation

## 2024-07-06 NOTE — ED Triage Notes (Signed)
 Pt arrives with c/o food bolus. Pt reports eating a piece of steak around 11am and it has been stuck in her esophagus since then. Pt reports that she is unable to keep down anything PO.

## 2024-07-06 NOTE — Anesthesia Procedure Notes (Signed)
 Procedure Name: Intubation Date/Time: 07/06/2024 10:14 PM  Performed by: Delores Evalene BROCKS, CRNAPre-anesthesia Checklist: Patient identified, Patient being monitored, Timeout performed, Emergency Drugs available and Suction available Patient Re-evaluated:Patient Re-evaluated prior to induction Oxygen Delivery Method: Circle system utilized Preoxygenation: Pre-oxygenation with 100% oxygen Induction Type: IV induction and Rapid sequence Laryngoscope Size: 3 and McGrath Grade View: Grade I Tube type: Oral Tube size: 7.0 mm Number of attempts: 1 Airway Equipment and Method: Stylet and Video-laryngoscopy Placement Confirmation: ETT inserted through vocal cords under direct vision, positive ETCO2 and breath sounds checked- equal and bilateral Secured at: 21 cm Tube secured with: Tape Dental Injury: Teeth and Oropharynx as per pre-operative assessment

## 2024-07-06 NOTE — Transfer of Care (Signed)
 Immediate Anesthesia Transfer of Care Note  Patient: Brittany [name]  Procedure(s) Performed: EGD (ESOPHAGOGASTRODUODENOSCOPY) REMOVAL, FOREIGN BODY  Patient Location: Endoscopy Unit  Anesthesia Type:General  Level of Consciousness: awake, alert , and oriented  Airway & Oxygen Therapy: Patient Spontanous Breathing  Post-op Assessment: Report given to RN and Post -op Vital signs reviewed and stable  Post vital signs: Reviewed and stable  Last Vitals:  Vitals Value Taken Time  BP 101/55 07/06/24 22:35  Temp 36.1 C 07/06/24 22:35  Pulse 88 07/06/24 22:39  Resp 19 07/06/24 22:39  SpO2 98 % 07/06/24 22:39  Vitals shown include unfiled device data.  Last Pain:  Vitals:   07/06/24 2235  TempSrc: Temporal  PainSc: Asleep         Complications: No notable events documented.

## 2024-07-06 NOTE — ED Notes (Signed)
 Report given to Endo. Stated to move patient OTF and they would be down to retrieve patient shortly.

## 2024-07-07 ENCOUNTER — Encounter: Payer: Self-pay | Admitting: Gastroenterology

## 2024-07-08 ENCOUNTER — Ambulatory Visit: Payer: Self-pay | Admitting: Gastroenterology

## 2024-07-08 LAB — SURGICAL PATHOLOGY

## 2024-07-09 NOTE — Anesthesia Postprocedure Evaluation (Signed)
 Anesthesia Post Note  Patient: Brittany [name]  Procedure(s) Performed: EGD (ESOPHAGOGASTRODUODENOSCOPY) REMOVAL, FOREIGN BODY  Patient location during evaluation: Endoscopy Anesthesia Type: General Level of consciousness: awake and alert Pain management: pain level controlled Vital Signs Assessment: post-procedure vital signs reviewed and stable Respiratory status: spontaneous breathing, nonlabored ventilation, respiratory function stable and patient connected to nasal cannula oxygen Cardiovascular status: blood pressure returned to baseline and stable Postop Assessment: no apparent nausea or vomiting Anesthetic complications: no   No notable events documented.   Last Vitals:  Vitals:   07/06/24 2245 07/06/24 2255  BP: (!) 108/57   Pulse:    Resp:    Temp:    SpO2:  100%    Last Pain:  Vitals:   07/06/24 2255  TempSrc:   PainSc: 0-No pain                 Prentice Murphy

## 2024-09-30 ENCOUNTER — Encounter: Payer: Self-pay | Admitting: Gastroenterology
